# Patient Record
Sex: Male | Born: 1970
Health system: Southern US, Community
[De-identification: ages and names within clinical notes are randomized; demographics above are authoritative.]

## PROBLEM LIST (undated history)

## (undated) DIAGNOSIS — C801 Malignant (primary) neoplasm, unspecified: Secondary | ICD-10-CM

## (undated) DIAGNOSIS — N189 Chronic kidney disease, unspecified: Secondary | ICD-10-CM

## (undated) DIAGNOSIS — I10 Essential (primary) hypertension: Secondary | ICD-10-CM

## (undated) DIAGNOSIS — R7303 Prediabetes: Secondary | ICD-10-CM

## (undated) HISTORY — PX: VASECTOMY: SHX75

## (undated) HISTORY — PX: ROTATOR CUFF REPAIR: SHX139

## (undated) HISTORY — PX: HERNIA REPAIR: SHX51

---

## 2004-02-03 ENCOUNTER — Emergency Department (HOSPITAL_COMMUNITY): Admission: EM | Admit: 2004-02-03 | Discharge: 2004-02-03 | Payer: Self-pay | Admitting: Emergency Medicine

## 2004-02-04 ENCOUNTER — Encounter (INDEPENDENT_AMBULATORY_CARE_PROVIDER_SITE_OTHER): Payer: Self-pay | Admitting: Cardiology

## 2004-02-04 ENCOUNTER — Inpatient Hospital Stay (HOSPITAL_COMMUNITY): Admission: EM | Admit: 2004-02-04 | Discharge: 2004-02-06 | Payer: Self-pay | Admitting: Neurology

## 2004-02-06 ENCOUNTER — Encounter: Payer: Self-pay | Admitting: Cardiology

## 2004-02-25 ENCOUNTER — Encounter: Admission: RE | Admit: 2004-02-25 | Discharge: 2004-05-25 | Payer: Self-pay | Admitting: Neurology

## 2007-09-02 ENCOUNTER — Inpatient Hospital Stay (HOSPITAL_COMMUNITY): Admission: EM | Admit: 2007-09-02 | Discharge: 2007-09-04 | Payer: Self-pay | Admitting: Emergency Medicine

## 2010-04-04 ENCOUNTER — Emergency Department (HOSPITAL_COMMUNITY): Admission: EM | Admit: 2010-04-04 | Discharge: 2010-04-04 | Payer: Self-pay | Admitting: Family Medicine
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
## 2011-01-22 NOTE — Consult Note (Signed)
NAME:  RENEL, ENDE NO.:  [id number]   MEDICAL RECORD NO.:  [id number]                   PATIENT TYPE:  INP   LOCATION:  NA                                   FACILITY:  [facility]   PHYSICIAN:  Melvyn Novas, M.D.               DATE OF BIRTH:  04-24-1971   DATE OF CONSULTATION:  DATE OF DISCHARGE:                                   CONSULTATION   This 40 year old otherwise very healthy African American right-handed  gentleman is seen on Feb 03, 2004, at [facility] for a second  time within 24 hours. The patient presents with left-sided arm and leg  numbness, clumsiness, and a tendency to fall. He was unsafe walking to the  bathroom. He states that he woke up at 5 a.m. this morning and noticed left-  sided numbness with pin and needle dysesthesia and then stumbled and fell to  the left. He had a numbness spell equal in quality last Saturday was less  severe and lasted for less than 30 minutes. The patient has been on no  medications, but has a history of hypertension which is currently untreated.  He states he came to the ER this morning, waited five hours, and was  discharged after his symptoms resolved and CT of the brain was negative.  When returning home, he tried to take a nap, his mother gave him aspirin and  he presented two hours later with all symptoms recurrent. He enters the ER  again. He still complains of left-sided numbness, a feeling of heaviness and  clumsiness in the left extremities. He also had transient neck pain and  stiffness which has now resolved.   No medications.   PAST MEDICAL HISTORY:  No past medical history except hypertension.   FAMILY HISTORY:  Hypertension in mother and grandfather.   SOCIAL HISTORY:  No alcohol and nonsmoker. He is single.   PHYSICAL EXAMINATION:  LUNGS: Clear to auscultation.  COR: Regular rate and rhythm.  VITAL SIGNS: Blood pressure 169/98, heart rate 88, temperature 98.7.  EXTREMITIES: The patient has no clubbing, cyanosis, or edema. He is  muscular, well built, and in no acute distress.  MENTAL STATUS: Alert and oriented times three.  NEUROLOGIC: Cranial nerves reveals full extraocular movements. The pupils  are equal and respond to accommodation as well as to light. There are no  visual field deficits. No papilledema. No diplopia. Facial symmetry and  sensory preserved. Tongue and uvula midline. Gag is normal. On motor exam,  5/5, equal grips, no ___________, equal strength, full dorsiflexion and  plantarflexion. All deep tendon reflexes are symmetric, downgoing toes to  plantar stimulation. On sensory exam, decreased primary modalities in arms  and legs on the left. On coordination, the patient drifts to the left when  sitting up.  Finger-to-nose test shows dysmetria on the left which is  significant. The patient can stand with assistance, but  again drifts to the  left, so we deferred the Romberg test.   PLAN:  We will admit the patient for workup of repeated paroxysmal left body  numbness and fall tendencies. This could be a cerebellar or brainstem event,  but it is also possible that the patient did not have an ischemic event, but  vasospasms and responds to hypertension. A dissection of his medullary signs  or demyelinating event given his age also not likely with his gender. He  denies having any preceding viral infections or any history of recent  medication changes or trauma. The patient needs MRI and MRA of the neck.  Since we cannot obtain this tonight at [facility] I suggest that  we transfer the patient to the [facility] stroke service for the  full  completement of workup. The patient will be admitted to the stroke MD  service  and followed by Dr. Delia Heady. No primary care physician is  listed.  The patient mentions that he would like to have his primary care  followed by the physician of his mother, Dr. Domingo Cocking  __________, with  [facility].                                               Melvyn Novas, M.D.    CD/MEDQ  D:  02/03/2004  T:  02/04/2004  Job:  [id number]

## 2011-01-22 NOTE — Discharge Summary (Signed)
NAME:  Dale Odom, Dale Odom                        ACCOUNT NO.:  [id number]   MEDICAL RECORD NO.:  [id number]                   PATIENT TYPE:  INP   LOCATION:  3017                                 FACILITY:  [facility]   PHYSICIAN:  Pramod P. Pearlean Brownie, MD                 DATE OF BIRTH:  1971-02-22   DATE OF ADMISSION:  02/04/2004  DATE OF DISCHARGE:  02/06/2004                                 DISCHARGE SUMMARY   ADMISSION DIAGNOSIS:  Stroke.   DISCHARGE DIAGNOSES:  1. Right posterior limb internal capsular infarction secondary to deep     penetrating artery disease.  2. Hypertension.  3. Hyperlipidemia.  4. Hyperhomocysteinemia.  5. Smoking.   HISTORY OF PRESENT ILLNESS:  Mr. Ozanich is a 40 year old African-American  male who developed sudden onset of left-sided paresthesias and weakness.  He  was seen at the emergency room at [facility] and since his  symptoms improved within 30 minutes and a baseline CT scan was normal, he  was discharged home.  He went back and slept the night.  When he woke up in  the morning he noted some recurrent symptoms with left-sided weakness and  numbness and came back to the emergency room for further evaluation.   At this time neurology was consulted and the patient was admitted for  further stroke risk stratification workup, and he was transferred to [facility], where he was admitted to the stroke unit.  He was kept on  telemetry monitoring, which did not reveal any significant cardiac  arrhythmias.  MRI scan of the brain was obtained the next day, which  revealed a small right posterior limb internal capsular artery infarction.  There was no high-grade stenosis noted of intracranial blood vessels.  The  carotid artery was found to reveal no significant stenosis.  Transcranial  Doppler studies revealed normal vein flow velocities in anterior  circulation.  The orbital views were suboptimal, limiting evaluation of  carotid and ophthalmic  arteries.  Transthoracic echocardiogram revealed  normal ejection fraction without obvious cardiac source of embolism.  Transthoracic bubble study revealed no evidence of intracardiac right-to-  left shunt.  The patient's homocysteine was elevated at 23.72 mg%.  Cholesterol was also elevated at 244 mg%.  Triglycerides were elevated at  362 mg%.  LDL 135 mg%.  HDL was 37 m%.  Given patient's young age, a  hypercoagulable panel was sent.  Protein C and S were normal.  Factor V  Leiden mutation was negative.  Antiphospholipid antibody panel was also  negative.  The patient was started on Foltx for his elevated homocysteine  levels and on Aggrenox one capsule once a day for two weeks, followed by  increasing it to twice a day for secondary stroke prevention.  Lipitor 10 mg  a day was added for his elevated lipids, and Altace 5 mg for his  hypertension.  The patient was advised  to go on a low-salt, low-fat diet and  also was advised smoking cessation counseling.  At time of discharge the  patient was able to ambulate independently, though he has slight minimum  weakness in the left side.  Physical and occupational therapy was consulted.  Initially the patient was thought to be a good candidate for rehab but since  he improved significantly, he was discharged home and arrangements were made  for outpatient PT and OT.   DISCHARGE MEDICATIONS:  1. Aggrenox one capsule once a day for two weeks and then twice a day.  2. Foltx one tablet daily.  3. Lipitor 10 mg a day.  4. Altace 5 mg a day.   The patient was advised to find himself a primary medical doctor to follow  up for his smoking cessation, hyperlipidemia, and hypertension.  We  recommended [employer]. Renae Gloss, M.D., or Olene Craven, M.D.  The  patient was given their phone numbers to call and make an appointment.  He  was also asked to see Dr. Pearlean Brownie and follow up in two to three months.                                                 Pramod P. Pearlean Brownie, MD    PPS/MEDQ  D:  03/16/2004  T:  03/16/2004  Job:  [id number]

## 2011-06-11 LAB — CBC
HCT: 40.7
HCT: 47.6
Hemoglobin: 14.2
Hemoglobin: 16
MCHC: 33.6
MCHC: 34.8
MCV: 84
MCV: 85.8
Platelets: 235
Platelets: 262
RBC: 4.84
RBC: 5.55
RDW: 13.6
RDW: 13.9
WBC: 14.1 — ABNORMAL HIGH
WBC: 8.1

## 2011-06-11 LAB — DIFFERENTIAL
Basophils Absolute: 0
Basophils Relative: 0
Eosinophils Absolute: 0
Eosinophils Relative: 0
Lymphocytes Relative: 8 — ABNORMAL LOW
Lymphs Abs: 1.1
Monocytes Absolute: 0.8
Monocytes Relative: 6
Neutro Abs: 12.1 — ABNORMAL HIGH
Neutrophils Relative %: 86 — ABNORMAL HIGH

## 2011-06-11 LAB — COMPREHENSIVE METABOLIC PANEL
ALT: 46
AST: 51 — ABNORMAL HIGH
Albumin: 4.1
Alkaline Phosphatase: 86
BUN: 14
CO2: 29
Calcium: 9.6
Chloride: 100
Creatinine, Ser: 1.7 — ABNORMAL HIGH
GFR calc Af Amer: 55 — ABNORMAL LOW
GFR calc non Af Amer: 46 — ABNORMAL LOW
Glucose, Bld: 125 — ABNORMAL HIGH
Potassium: 4.5
Sodium: 135
Total Bilirubin: 0.7
Total Protein: 7.3

## 2011-06-11 LAB — BASIC METABOLIC PANEL
BUN: 10
CO2: 30
Calcium: 9.1
Chloride: 101
Creatinine, Ser: 1.77 — ABNORMAL HIGH
GFR calc Af Amer: 53 — ABNORMAL LOW
GFR calc non Af Amer: 44 — ABNORMAL LOW
Glucose, Bld: 103 — ABNORMAL HIGH
Potassium: 4.2
Sodium: 135

## 2011-07-18 ENCOUNTER — Emergency Department (HOSPITAL_COMMUNITY)
Admission: EM | Admit: 2011-07-18 | Discharge: 2011-07-18 | Disposition: A | Discharge status: Home / Self Care | Payer: 59 | Source: Home / Self Care

## 2011-07-18 ENCOUNTER — Encounter: Payer: Self-pay | Admitting: *Deleted

## 2011-07-18 DIAGNOSIS — IMO0001 Reserved for inherently not codable concepts without codable children: Secondary | ICD-10-CM

## 2011-07-18 DIAGNOSIS — L259 Unspecified contact dermatitis, unspecified cause: Secondary | ICD-10-CM

## 2011-07-18 DIAGNOSIS — M791 Myalgia, unspecified site: Secondary | ICD-10-CM

## 2011-07-18 HISTORY — DX: Essential (primary) hypertension: I10

## 2011-07-18 LAB — POCT URINALYSIS DIP (DEVICE)
Bilirubin Urine: NEGATIVE
Glucose, UA: NEGATIVE mg/dL
Ketones, ur: NEGATIVE mg/dL
Leukocytes, UA: NEGATIVE
Nitrite: NEGATIVE
Protein, ur: 30 mg/dL — AB
Specific Gravity, Urine: 1.015 (ref 1.005–1.030)
Urobilinogen, UA: 0.2 mg/dL (ref 0.0–1.0)
pH: 6.5 (ref 5.0–8.0)

## 2011-07-18 MED ORDER — PSEUDOEPHEDRINE-CODEINE-GG 30-10-100 MG/5ML PO SOLN: 10.0000 mL | Freq: Four times a day (QID) | ORAL | Status: AC | PRN

## 2011-07-18 MED ORDER — IBUPROFEN 800 MG PO TABS: 800.0000 mg | ORAL_TABLET | Freq: Three times a day (TID) | ORAL | Status: AC

## 2011-07-18 MED ORDER — TRIAMCINOLONE ACETONIDE 0.5 % EX OINT: TOPICAL_OINTMENT | Freq: Two times a day (BID) | CUTANEOUS | Status: AC

## 2011-07-18 MED ORDER — CYCLOBENZAPRINE HCL 10 MG PO TABS: 10.0000 mg | ORAL_TABLET | Freq: Two times a day (BID) | ORAL | Status: AC | PRN

## 2011-07-18 NOTE — ED Notes (Signed)
Reports pain in left abd intermittently, occurring as sharp pain only when he coughs - slowly dissipates after couple minutes.  No pain at rest.  Pains started 5 days ago.  Last BM this AM - reports as normal.  Denies n/v/d, denies constipation or fevers.  Left side tender to palpation.  Denies rib pain w/ palpation.

## 2011-07-18 NOTE — ED Provider Notes (Signed)
History     CSN: [id number] Arrival date & time: 07/18/2011  9:37 AM   First MD Initiated Contact with Patient 07/18/11 [phone]      Chief Complaint  Patient presents with  . Flank Pain    Left Side Pain    (Consider location/radiation/quality/duration/timing/severity/associated sxs/prior treatment) Patient is a 40 y.o. male presenting with flank pain and rash. The history is provided by the patient.  Flank Pain This is a new (c/o left flank pain for 5 days after recurrent coughing due to a cold that started 1 week ago and is now resolving.) problem. Episode frequency: only with cough and certain movements. The problem has not changed since onset.Pertinent negatives include no chest pain, no abdominal pain, no headaches and no shortness of breath. Associated symptoms comments: No dysuria or hematuria. No changes in bowel movements, no constipation or diarrhea. No fever or chills.. The symptoms are aggravated by bending, twisting and coughing. The symptoms are relieved by position. He has tried nothing for the symptoms.  Rash  This is a new problem. The current episode started more than 2 days ago. The problem has not changed since onset.The problem is associated with plant contact (appeared after working in the yard.). There has been no fever. The rash is present on the left arm and right arm. The pain is at a severity of 0/10. The patient is experiencing no pain. Associated symptoms include itching. Pertinent negatives include no pain. He has tried steriods (OTC hydrocortisone) for the symptoms. The treatment provided mild relief.    Past Medical History  Diagnosis Date  . Hypertension     Past Surgical History  Procedure Date  . Hernia repair   . Vasectomy     No family history on file.  History  Substance Use Topics  . Smoking status: Current Everyday Smoker -- 1.0 packs/day    Types: Cigarettes  . Smokeless tobacco: Not on file  . Alcohol Use: No      Review of Systems    Constitutional: Negative for fever, chills, appetite change and fatigue.  HENT: Positive for congestion and sneezing. Negative for rhinorrhea and sinus pressure.   Respiratory: Positive for cough. Negative for chest tightness, shortness of breath and wheezing.   Cardiovascular: Negative for chest pain, palpitations and leg swelling.  Gastrointestinal: Negative for nausea, vomiting, abdominal pain, diarrhea and constipation.  Genitourinary: Positive for flank pain. Negative for dysuria, frequency and difficulty urinating.  Musculoskeletal: Positive for myalgias.  Skin: Positive for itching and rash.  Neurological: Negative for dizziness and headaches.    Allergies  Review of patient's allergies indicates no known allergies.  Home Medications   Current Outpatient Rx  Name Route Sig Dispense Refill  . LISINOPRIL-HYDROCHLOROTHIAZIDE PO Oral Take by mouth.      . CYCLOBENZAPRINE HCL 10 MG PO TABS Oral Take 1 tablet (10 mg total) by mouth 2 (two) times daily as needed for muscle spasms. 20 tablet 0  . IBUPROFEN 800 MG PO TABS Oral Take 1 tablet (800 mg total) by mouth 3 (three) times daily. 21 tablet 0  . PSEUDOEPHEDRINE-CODEINE-GG 30-10-100 MG/5ML PO SOLN Oral Take 10 mLs by mouth 4 (four) times daily as needed for cough. 120 mL 0  . TRIAMCINOLONE ACETONIDE 0.5 % EX OINT Topical Apply topically 2 (two) times daily. 30 g 0    BP 134/93  Pulse 87  Temp(Src) 98.5 F (36.9 C) (Oral)  Resp 16  SpO2 97%  Physical Exam  Nursing note and vitals  reviewed. Constitutional: He is oriented to person, place, and time. He appears well-developed and well-nourished. No distress.  HENT:  Head: Normocephalic and atraumatic.  Mouth/Throat: No oropharyngeal exudate.       Nasal congestion  Eyes: Conjunctivae and EOM are normal. Pupils are equal, round, and reactive to light.  Cardiovascular: Normal rate, regular rhythm and normal heart sounds.   Pulmonary/Chest: Effort normal and breath sounds  normal. No respiratory distress. He has no wheezes. He has no rales. He exhibits no tenderness.  Abdominal: Soft. Bowel sounds are normal. He exhibits no distension and no mass. There is no tenderness. There is no rebound and no guarding.  Musculoskeletal:       Impress increased tone and enderness with palpation and with bending along left transverse muscle. No swelling or skin changes above.  Lymphadenopathy:    He has no cervical adenopathy.  Neurological: He is alert and oriented to person, place, and time.  Skin:       Bilateral paulo microvesicular erythema in volar surfaces of both forearms with few crusted scratch marks no signs of over infection.    ED Course  Procedures (including critical care time)  Labs Reviewed  POCT URINALYSIS DIP (DEVICE) - Abnormal; Notable for the following:    Hgb urine dipstick TRACE (*)    Protein, ur 30 (*)    All other components within normal limits  LAB REPORT - SCANNED   No results found.   1. Muscle ache   2. Contact dermatitis       MDM          Leibish Mcgregor Moreno-Coll 07/19/11 7829

## 2012-07-26 LAB — HM COLONOSCOPY

## 2014-09-30 ENCOUNTER — Ambulatory Visit (INDEPENDENT_AMBULATORY_CARE_PROVIDER_SITE_OTHER): Payer: 59 | Admitting: Family Medicine

## 2014-09-30 VITALS — BP 125/85 | HR 91 | Temp 98.1°F | Resp 18 | Ht 72.0 in | Wt 232.8 lb

## 2014-09-30 DIAGNOSIS — R739 Hyperglycemia, unspecified: Secondary | ICD-10-CM

## 2014-09-30 DIAGNOSIS — L03113 Cellulitis of right upper limb: Secondary | ICD-10-CM

## 2014-09-30 DIAGNOSIS — L209 Atopic dermatitis, unspecified: Secondary | ICD-10-CM

## 2014-09-30 LAB — POCT CBC
Granulocyte percent: 66.1 %G (ref 37–80)
HCT, POC: 44.6 % (ref 43.5–53.7)
Hemoglobin: 14.9 g/dL (ref 14.1–18.1)
Lymph, poc: 2.5 (ref 0.6–3.4)
MCH, POC: 29.8 pg (ref 27–31.2)
MCHC: 33.4 g/dL (ref 31.8–35.4)
MCV: 89.4 fL (ref 80–97)
MID (cbc): 0.5 (ref 0–0.9)
MPV: 6.9 fL (ref 0–99.8)
POC Granulocyte: 5.7 (ref 2–6.9)
POC LYMPH PERCENT: 28.5 %L (ref 10–50)
POC MID %: 5.4 %M (ref 0–12)
Platelet Count, POC: 223 10*3/uL (ref 142–424)
RBC: 4.99 M/uL (ref 4.69–6.13)
RDW, POC: 13.9 %
WBC: 8.6 10*3/uL (ref 4.6–10.2)

## 2014-09-30 LAB — POCT GLYCOSYLATED HEMOGLOBIN (HGB A1C): Hemoglobin A1C: 5.8

## 2014-09-30 MED ORDER — TRIAMCINOLONE ACETONIDE 0.1 % EX CREA: 1.0000 "application " | TOPICAL_CREAM | Freq: Two times a day (BID) | CUTANEOUS | Status: DC

## 2014-09-30 MED ORDER — CEFTRIAXONE SODIUM 1 G IJ SOLR
1.0000 g | Freq: Once | INTRAMUSCULAR | Status: AC
  Administered 2014-09-30: 1 g via INTRAMUSCULAR

## 2014-09-30 MED ORDER — DOXYCYCLINE HYCLATE 100 MG PO TABS: 100.0000 mg | ORAL_TABLET | Freq: Two times a day (BID) | ORAL | Status: DC

## 2014-09-30 MED ORDER — HYDROCODONE-ACETAMINOPHEN 5-325 MG PO TABS: 1.0000 | ORAL_TABLET | Freq: Three times a day (TID) | ORAL | Status: DC | PRN

## 2014-09-30 NOTE — Patient Instructions (Signed)
Cellulitis °Cellulitis is an infection of the skin and the tissue beneath it. The infected area is usually red and tender. Cellulitis occurs most often in the arms and lower legs.  °CAUSES  °Cellulitis is caused by bacteria that enter the skin through cracks or cuts in the skin. The most common types of bacteria that cause cellulitis are staphylococci and streptococci. °SIGNS AND SYMPTOMS  °· Redness and warmth. °· Swelling. °· Tenderness or pain. °· Fever. °DIAGNOSIS  °Your health care provider can usually determine what is wrong based on a physical exam. Blood tests may also be done. °TREATMENT  °Treatment usually involves taking an antibiotic medicine. °HOME CARE INSTRUCTIONS  °· Take your antibiotic medicine as directed by your health care provider. Finish the antibiotic even if you start to feel better. °· Keep the infected arm or leg elevated to reduce swelling. °· Apply a warm cloth to the affected area up to 4 times per day to relieve pain. °· Take medicines only as directed by your health care provider. °· Keep all follow-up visits as directed by your health care provider. °SEEK MEDICAL CARE IF:  °· You notice red streaks coming from the infected area. °· Your red area gets larger or turns dark in color. °· Your bone or joint underneath the infected area becomes painful after the skin has healed. °· Your infection returns in the same area or another area. °· You notice a swollen bump in the infected area. °· You develop new symptoms. °· You have a fever. °SEEK IMMEDIATE MEDICAL CARE IF:  °· You feel very sleepy. °· You develop vomiting or diarrhea. °· You have a general ill feeling (malaise) with muscle aches and pains. °MAKE SURE YOU:  °· Understand these instructions. °· Will watch your condition. °· Will get help right away if you are not doing well or get worse. °Document Released: 06/02/2005 Document Revised: 01/07/2014 Document Reviewed: 11/08/2011 °Exit[employer]® Patient Information ©2015 [employer].  This information is not intended to replace advice given to you by your health care provider. Make sure you discuss any questions you have with your health care provider. ° °

## 2014-09-30 NOTE — Progress Notes (Signed)
Chief Complaint:  Chief Complaint  Patient presents with  . Elbow Pain    right x 4 days     HPI: Dale Odom is a 44 y.o. male who is here for  4 day history of right peri-elbow cellulitis, he does not haveany known injury, he does have a scab over here, he ahs had no fevers or cguk, he has had redness, swelling and pain an dit radiates.  He has full ROM  Past Medical History  Diagnosis Date  . Hypertension    Past Surgical History  Procedure Laterality Date  . Hernia repair    . Vasectomy     History   Social History  . Marital Status: Married    Spouse Name: N/A    Number of Children: N/A  . Years of Education: N/A   Social History Main Topics  . Smoking status: Current Every Day Smoker -- 1.00 packs/day    Types: Cigarettes  . Smokeless tobacco: None  . Alcohol Use: No  . Drug Use: No  . Sexual Activity: Yes    Birth Control/ Protection: Surgical   Other Topics Concern  . None   Social History Narrative   History reviewed. No pertinent family history. No Known Allergies Prior to Admission medications   Medication Sig Start Date End Date Taking? Authorizing Provider  LISINOPRIL-HYDROCHLOROTHIAZIDE PO Take by mouth.     Yes Historical Provider, MD     ROS: The patient denies fevers, chills, night sweats, unintentional weight loss, chest pain, palpitations, wheezing, dyspnea on exertion, nausea, vomiting, abdominal pain, dysuria, hematuria, melena, numbness, weakness, or tingling.   All other systems have been reviewed and were otherwise negative with the exception of those mentioned in the HPI and as above.    PHYSICAL EXAM: Filed Vitals:   09/30/14 1228  BP: 125/85  Pulse: 91  Temp: 98.1 F (36.7 C)  Resp: 18   Filed Vitals:   09/30/14 1228  Height: 6' (1.829 m)  Weight: 232 lb 12.8 oz (105.597 kg)   Body mass index is 31.57 kg/(m^2).  General: Alert, no acute distress HEENT:  Normocephalic, atraumatic, oropharynx patent. EOMI,  PERRLA Cardiovascular:  Regular rate and rhythm, no rubs murmurs or gallops.  No Carotid bruits, radial pulse intact. No pedal edema.  Respiratory: Clear to auscultation bilaterally.  No wheezes, rales, or rhonchi.  No cyanosis, no use of accessory musculature GI: No organomegaly, abdomen is soft and non-tender, positive bowel sounds.  No masses. Skin: + eczema on left upper arm. + right elbow cellulitis. Neurologic: Facial musculature symmetric. Psychiatric: Patient is appropriate throughout our interaction. Lymphatic: No cervical lymphadenopathy Musculoskeletal: Gait intact. + full ROm and strength of elbow.    LABS: Results for orders placed or performed during the hospital encounter of 07/18/11  POCT urinalysis dip (device)  Result Value Ref Range   Glucose, UA NEGATIVE NEGATIVE mg/dL   Bilirubin Urine NEGATIVE NEGATIVE   Ketones, ur NEGATIVE NEGATIVE mg/dL   Specific Gravity, Urine 1.015 1.005 - 1.030   Hgb urine dipstick TRACE (A) NEGATIVE   pH 6.5 5.0 - 8.0   Protein, ur 30 (A) NEGATIVE mg/dL   Urobilinogen, UA 0.2 0.0 - 1.0 mg/dL   Nitrite NEGATIVE NEGATIVE   Leukocytes, UA NEGATIVE NEGATIVE     EKG/XRAY:   Primary read interpreted by Dr. Marin Comment at [facility].   ASSESSMENT/PLAN: Encounter Diagnoses  Name Primary?  . Cellulitis of right upper extremity Yes  . Atopic eczema   .  Hyperglycemia    Rx rocephin IM x 1 in house Rx Doxcycline 100 mg BID x 10 days, coverage for MRSA since he works in [employer] at the hospital RX triamcinolone for [place] for pain Precautions given for spetic joint , worsenign sxs.  Marked with skin marker  Gross sideeffects, risk and benefits, and alternatives of medications d/w patient. Patient is aware that all medications have potential sideeffects and we are unable to predict every sideeffect or drug-drug interaction that may occur.  LE, Robert Lee, DO 09/30/2014 2:01 PM

## 2014-10-13 ENCOUNTER — Ambulatory Visit (INDEPENDENT_AMBULATORY_CARE_PROVIDER_SITE_OTHER): Payer: 59 | Admitting: Family Medicine

## 2014-10-13 VITALS — BP 116/82 | HR 74 | Temp 97.4°F | Resp 16 | Ht 73.0 in | Wt 236.8 lb

## 2014-10-13 DIAGNOSIS — L03113 Cellulitis of right upper limb: Secondary | ICD-10-CM

## 2014-10-13 DIAGNOSIS — L03818 Cellulitis of other sites: Secondary | ICD-10-CM

## 2014-10-13 DIAGNOSIS — M25521 Pain in right elbow: Secondary | ICD-10-CM

## 2014-10-13 MED ORDER — DOXYCYCLINE HYCLATE 100 MG PO TABS: 100.0000 mg | ORAL_TABLET | Freq: Two times a day (BID) | ORAL | Status: DC

## 2014-10-13 NOTE — Patient Instructions (Signed)
Olecranon Bursitis Bursitis is swelling and soreness (inflammation) of a fluid-filled sac (bursa) that covers and protects a joint. Olecranon bursitis occurs over the elbow.  CAUSES Bursitis can be caused by injury, overuse of the joint, arthritis, or infection.  SYMPTOMS   Tenderness, swelling, warmth, or redness over the elbow.  Elbow pain with movement. This is greater with bending the elbow.  Squeaking sound when the bursa is rubbed or moved.  Increasing size of the bursa without pain or discomfort.  Fever with increasing pain and swelling if the bursa becomes infected. HOME CARE INSTRUCTIONS   Put ice on the affected area.  Put ice in a plastic bag.  Place a towel between your skin and the bag.  Leave the ice on for 15-20 minutes each hour while awake. Do this for the first 2 days.  When resting, elevate your elbow above the level of your heart. This helps reduce swelling.  Continue to put the joint through a full range of motion 4 times per day. Rest the injured joint at other times. When the pain lessens, begin normal slow movements and usual activities.  Only take over-the-counter or prescription medicines for pain, discomfort, or fever as directed by your caregiver.  Reduce your intake of milk and related dairy products (cheese, yogurt). They may make your condition worse. SEEK IMMEDIATE MEDICAL CARE IF:   Your pain increases even during treatment.  You have a fever.  You have heat and inflammation over the bursa and elbow.  You have a red line that goes up your arm.  You have pain with movement of your elbow. MAKE SURE YOU:   Understand these instructions.  Will watch your condition.  Will get help right away if you are not doing well or get worse. Document Released: 09/22/2006 Document Revised: 11/15/2011 Document Reviewed: 08/08/2007 [facility] Patient Information 2015 [place], [place]. This information is not intended to replace advice given to you by your  health care provider. Make sure you discuss any questions you have with your health care provider. Cellulitis Cellulitis is an infection of the skin and the tissue beneath it. The infected area is usually red and tender. Cellulitis occurs most often in the arms and lower legs.  CAUSES  Cellulitis is caused by bacteria that enter the skin through cracks or cuts in the skin. The most common types of bacteria that cause cellulitis are staphylococci and streptococci. SIGNS AND SYMPTOMS   Redness and warmth.  Swelling.  Tenderness or pain.  Fever. DIAGNOSIS  Your health care provider can usually determine what is wrong based on a physical exam. Blood tests may also be done. TREATMENT  Treatment usually involves taking an antibiotic medicine. HOME CARE INSTRUCTIONS   Take your antibiotic medicine as directed by your health care provider. Finish the antibiotic even if you start to feel better.  Keep the infected arm or leg elevated to reduce swelling.  Apply a warm cloth to the affected area up to 4 times per day to relieve pain.  Take medicines only as directed by your health care provider.  Keep all follow-up visits as directed by your health care provider. SEEK MEDICAL CARE IF:   You notice red streaks coming from the infected area.  Your red area gets larger or turns dark in color.  Your bone or joint underneath the infected area becomes painful after the skin has healed.  Your infection returns in the same area or another area.  You notice a swollen bump in the  infected area.  You develop new symptoms.  You have a fever. SEEK IMMEDIATE MEDICAL CARE IF:   You feel very sleepy.  You develop vomiting or diarrhea.  You have a general ill feeling (malaise) with muscle aches and pains. MAKE SURE YOU:   Understand these instructions.  Will watch your condition.  Will get help right away if you are not doing well or get worse. Document Released: 06/02/2005 Document  Revised: 01/07/2014 Document Reviewed: 11/08/2011 [facility] Patient Information 2015 [place], [place]. This information is not intended to replace advice given to you by your health care provider. Make sure you discuss any questions you have with your health care provider.

## 2014-10-13 NOTE — Progress Notes (Signed)
Chief Complaint:  Chief Complaint  Patient presents with  . Follow-up    cellulitis r elbow    HPI: Dale Odom is a 44 y.o. male who is here for  Right elbow cellulitis recheck, doing bettter, redness is gone,  Area where he has been putting warm compresses have peeling dry skin, he is moving elbow better.  He still has pain and minimal swelling when he flexes at olecranon, still minimal swelling that is noticeable when he bends his elbow.  Has been looking up how he can get rid of it on google and wants to know if it will go away soon. He has had no SEs with meds. Was on doxycycline for 10 days.  Past Medical History  Diagnosis Date  . Hypertension    Past Surgical History  Procedure Laterality Date  . Hernia repair    . Vasectomy     History   Social History  . Marital Status: Married    Spouse Name: N/A    Number of Children: N/A  . Years of Education: N/A   Social History Main Topics  . Smoking status: Current Every Day Smoker -- 1.00 packs/day    Types: Cigarettes  . Smokeless tobacco: None  . Alcohol Use: No  . Drug Use: No  . Sexual Activity: Yes    Birth Control/ Protection: Surgical   Other Topics Concern  . None   Social History Narrative   History reviewed. No pertinent family history. No Known Allergies Prior to Admission medications   Medication Sig Start Date End Date Taking? Authorizing Provider  HYDROcodone-acetaminophen (NORCO) 5-325 MG per tablet Take 1 tablet by mouth every 8 (eight) hours as needed for moderate pain. Take with stool softener, no extra tylenol 09/30/14  Yes Thao P Le, DO  LISINOPRIL-HYDROCHLOROTHIAZIDE PO Take by mouth.     Yes Historical Provider, MD  triamcinolone cream (KENALOG) 0.1 % Apply 1 application topically 2 (two) times daily. 09/30/14  Yes Thao P Le, DO  doxycycline (VIBRA-TABS) 100 MG tablet Take 1 tablet (100 mg total) by mouth 2 (two) times daily. Patient not taking: Reported on 10/13/2014 09/30/14   Thao  P Le, DO     ROS: The patient denies fevers, chills, night sweats, unintentional weight loss, chest pain, palpitations, wheezing, dyspnea on exertion, nausea, vomiting, abdominal pain, dysuria, hematuria, melena, numbness, weakness, or tingling.  All other systems have been reviewed and were otherwise negative with the exception of those mentioned in the HPI and as above.    PHYSICAL EXAM: Filed Vitals:   10/13/14 1020  BP: 116/82  Pulse: 74  Temp: 97.4 F (36.3 C)  Resp: 16   Filed Vitals:   10/13/14 1020  Height: 6\' 1"  (1.854 m)  Weight: 236 lb 12.8 oz (107.412 kg)   Body mass index is 31.25 kg/(m^2).  General: Alert, no acute distress HEENT:  Normocephalic, atraumatic, oropharynx patent. EOMI, PERRLA Cardiovascular:  Regular rate and rhythm, no rubs murmurs or gallops.  No Carotid bruits, radial pulse intact. No pedal edema.  Respiratory: Clear to auscultation bilaterally.  No wheezes, rales, or rhonchi.  No cyanosis, no use of accessory musculature GI: No organomegaly, abdomen is soft and non-tender, positive bowel sounds.  No masses. Skin: No rashes. Neurologic: Facial musculature symmetric. Psychiatric: Patient is appropriate throughout our interaction. Lymphatic: No cervical lymphadenopathy Musculoskeletal: Gait intact. Right elbow cellulitis vastly improved with some olcranon bursa swelling.  Full ROM of elbow, no e.op spetic joint  LABS: Results for orders placed or performed in visit on 09/30/14  POCT CBC  Result Value Ref Range   WBC 8.6 4.6 - 10.2 K/uL   Lymph, poc 2.5 0.6 - 3.4   POC LYMPH PERCENT 28.5 10 - 50 %L   MID (cbc) 0.5 0 - 0.9   POC MID % 5.4 0 - 12 %M   POC Granulocyte 5.7 2 - 6.9   Granulocyte percent 66.1 37 - 80 %G   RBC 4.99 4.69 - 6.13 M/uL   Hemoglobin 14.9 14.1 - 18.1 g/dL   HCT, POC 44.6 43.5 - 53.7 %   MCV 89.4 80 - 97 fL   MCH, POC 29.8 27 - 31.2 pg   MCHC 33.4 31.8 - 35.4 g/dL   RDW, POC 13.9 %   Platelet Count, POC 223 142 -  424 K/uL   MPV 6.9 0 - 99.8 fL  POCT glycosylated hemoglobin (Hb A1C)  Result Value Ref Range   Hemoglobin A1C 5.8      EKG/XRAY:   Primary read interpreted by Dr. Marin Comment at [facility].   ASSESSMENT/PLAN: Encounter Diagnoses  Name Primary?  . Cellulitis of other specified site Yes  . Elbow pain, right   . Cellulitis of right upper extremity    Rx doxy for another 7 days for prevention since cellulitis for most part is resolved  but he will stop after this. He may after all this heals have a little olecranon bursitis but I do not feel there is anythign do for this. An aspiration at this time will  Have more risks than benefits Can work out in moderation F/u prn   Gross sideeffects, risk and benefits, and alternatives of medications d/w patient. Patient is aware that all medications have potential sideeffects and we are unable to predict every sideeffect or drug-drug interaction that may occur.  LE, [place], DO 10/13/2014 10:53 AM

## 2014-11-12 ENCOUNTER — Other Ambulatory Visit: Payer: Self-pay | Admitting: Nephrology

## 2014-11-12 DIAGNOSIS — N183 Chronic kidney disease, stage 3 unspecified: Secondary | ICD-10-CM

## 2014-11-19 ENCOUNTER — Ambulatory Visit
Admission: RE | Admit: 2014-11-19 | Discharge: 2014-11-19 | Disposition: A | Discharge status: Home / Self Care | Payer: 59 | Source: Ambulatory Visit | Attending: Nephrology | Admitting: Nephrology

## 2014-11-19 DIAGNOSIS — N183 Chronic kidney disease, stage 3 unspecified: Secondary | ICD-10-CM

## 2015-01-02 ENCOUNTER — Ambulatory Visit (INDEPENDENT_AMBULATORY_CARE_PROVIDER_SITE_OTHER): Payer: 59

## 2015-01-02 ENCOUNTER — Ambulatory Visit (INDEPENDENT_AMBULATORY_CARE_PROVIDER_SITE_OTHER): Payer: 59 | Admitting: Podiatry

## 2015-01-02 ENCOUNTER — Encounter: Payer: Self-pay | Admitting: Podiatry

## 2015-01-02 ENCOUNTER — Telehealth: Payer: Self-pay | Admitting: *Deleted

## 2015-01-02 VITALS — BP 157/95 | HR 63 | Resp 16

## 2015-01-02 DIAGNOSIS — Q828 Other specified congenital malformations of skin: Secondary | ICD-10-CM | POA: Diagnosis not present

## 2015-01-02 DIAGNOSIS — M79673 Pain in unspecified foot: Secondary | ICD-10-CM

## 2015-01-02 DIAGNOSIS — B353 Tinea pedis: Secondary | ICD-10-CM

## 2015-01-02 DIAGNOSIS — M2040 Other hammer toe(s) (acquired), unspecified foot: Secondary | ICD-10-CM

## 2015-01-02 MED ORDER — NAFTIFINE HCL 2 % EX CREA: 1.0000 [drp] | TOPICAL_CREAM | CUTANEOUS | Status: DC

## 2015-01-02 NOTE — Progress Notes (Signed)
   Subjective:    Patient ID: Dale Odom, male    DOB: 1971-04-24, 44 y.o.   MRN: [id number]  HPI Comments: "I have all these calluses"  Patient c/o tender callused areas 1st toes and 4th toes bilateral and lateral heels bilateral for several years. Very painful when he walks. He gets regular pedicures to have them trimmed. Also uses athletes feet spray.     Review of Systems  Musculoskeletal: Positive for gait problem.  All other systems reviewed and are negative.      Objective:   Physical Exam: I have reviewed his past medical history medications allergy surgery social history and review of systems. Pulses are strongly palpable bilateral neurologic sensorium is intact per Semmes-Weinstein monofilament. Deep tendon reflexes are intact bilaterally muscle strength is 5 over 5 dorsiflexion plantar flexors and inverters everters all digits of musculature is intact. Orthopedic evaluation demonstrates mild-to-moderate pes planus bilateral. He has stiffness and limitation on range of motion of all of his metatarsophalangeal joints. Ankle joint appears to be normal. Radiographic evaluation demonstrates normal osseous foot in appearance. However the sulcus does not send all way to the metatarsophalangeal joint. He has normal bone length on radiograph however his toes appear to be very short period social a shallow sulcus is more than likely was resulting in his limitation on range of motion. Cutaneous evaluation demonstrates tinea pedis bilateral and multiple areas of porokeratosis and tyloma was bilateral. At this point I debrided all of these today        Assessment & Plan:  Assessment: Tinea pedis and poor keratoses bilateral.  Plan: Debridement of reactive hyperkeratoses bilateral. Also discussed the need for orthotics. He declined. Follow up with him in 1 month he will start the use of Naftin gel twice daily.

## 2015-01-02 NOTE — Telephone Encounter (Signed)
[name] states the directions for the pt's Naftin Cream are not clear.  Informed Dale Odom the instruction for Naftin Cream were apply to affected area daily.

## 2015-06-03 ENCOUNTER — Encounter: Payer: Self-pay | Admitting: Podiatry

## 2015-06-03 ENCOUNTER — Ambulatory Visit (INDEPENDENT_AMBULATORY_CARE_PROVIDER_SITE_OTHER): Payer: 59 | Admitting: Podiatry

## 2015-06-03 VITALS — BP 122/78 | HR 78 | Resp 16

## 2015-06-03 DIAGNOSIS — M2040 Other hammer toe(s) (acquired), unspecified foot: Secondary | ICD-10-CM | POA: Diagnosis not present

## 2015-06-03 DIAGNOSIS — Q828 Other specified congenital malformations of skin: Secondary | ICD-10-CM

## 2015-06-03 NOTE — Progress Notes (Signed)
He presents today for follow-up of his poor keratoses to his lesser digits bilaterally. He states these are becoming so painful on starting To take pain pills. He states that I am unable to reach the lesions in between mitoses however I can adequately do bride the keratotic lesions on the bottom of my feet. He states that he has diabetes and he tries to control his diabetes with exercise at the gym.  Objective: Vital signs are stable he is alert and oriented 3. He is well known to me. No acute distress vital signs stable alert and oriented 3 pulses are palpable. Reactive hyperkeratosis of porokeratosis to the plantar aspect and the lateral aspect of the knee of his toes. The left foot in particular appears to have adductovarus rotated hammertoes. He has very short toes but on radiograph they are of normal length. I think that is because of the rotation of the toes and the length of the toes that these reactive hyperkeratosis are worse although the DIPJ.  Assessment: Porokeratosis bilateral. 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  Plan: I am requesting that he see Dr. Jacqualyn Posey just for an evaluation and possibly come up with another idea to help relieve some of his symptoms. I'm wondering if derotational arthroplasties would work for him. We debrided all reactive hyperkeratosis today without iatrogenic lesions.

## 2015-06-10 ENCOUNTER — Other Ambulatory Visit: Payer: Self-pay | Admitting: Podiatry

## 2015-07-04 ENCOUNTER — Ambulatory Visit: Payer: 59 | Admitting: Podiatry

## 2015-10-06 MED FILL — ROSUVASTATIN CALCIUM 40 MG: 40 | 90 days supply | Qty: 90 | Fill #1

## 2015-10-06 MED FILL — valACYclovir HCL 1 GM TABS: 1 | 30 days supply | Qty: 30 | Fill #6

## 2015-10-06 MED FILL — LISINOPRIL-HCTZ 20-25 MG TA: 20-25 | 90 days supply | Qty: 90 | Fill #2

## 2015-11-20 ENCOUNTER — Other Ambulatory Visit: Payer: Self-pay | Admitting: Podiatry

## 2015-11-20 MED FILL — valACYclovir HCL 1 GM TABS: 1 | 30 days supply | Qty: 30 | Fill #7

## 2015-11-20 MED FILL — NAFTIFINE HCL 2% CREAM: 2 | 60 days supply | Qty: 60 | Fill #0

## 2015-11-20 MED FILL — CIALIS 10 MG TABLET: 10 | 60 days supply | Qty: 12 | Fill #1

## 2015-12-01 DIAGNOSIS — N183 Chronic kidney disease, stage 3 (moderate): Secondary | ICD-10-CM | POA: Diagnosis not present

## 2015-12-01 DIAGNOSIS — E7800 Pure hypercholesterolemia, unspecified: Secondary | ICD-10-CM | POA: Diagnosis not present

## 2015-12-01 DIAGNOSIS — R7309 Other abnormal glucose: Secondary | ICD-10-CM | POA: Diagnosis not present

## 2016-01-08 DIAGNOSIS — B353 Tinea pedis: Secondary | ICD-10-CM

## 2016-01-08 MED FILL — valACYclovir HCL 1 GM TABS: 1 | 30 days supply | Qty: 30 | Fill #8

## 2016-01-08 MED FILL — CIALIS 10 MG TABLET: 10 | 60 days supply | Qty: 12 | Fill #0

## 2016-02-04 DIAGNOSIS — N2581 Secondary hyperparathyroidism of renal origin: Secondary | ICD-10-CM | POA: Diagnosis not present

## 2016-02-04 DIAGNOSIS — R7989 Other specified abnormal findings of blood chemistry: Secondary | ICD-10-CM | POA: Diagnosis not present

## 2016-02-04 DIAGNOSIS — Z720 Tobacco use: Secondary | ICD-10-CM | POA: Diagnosis not present

## 2016-02-04 DIAGNOSIS — N183 Chronic kidney disease, stage 3 (moderate): Secondary | ICD-10-CM | POA: Diagnosis not present

## 2016-03-08 MED FILL — LISINOPRIL-HCTZ 20-25 MG TA: 20-25 | 90 days supply | Qty: 90 | Fill #0

## 2016-03-08 MED FILL — ROSUVASTATIN CALCIUM 40 MG: 40 | 90 days supply | Qty: 90 | Fill #0

## 2016-03-11 DIAGNOSIS — E1121 Type 2 diabetes mellitus with diabetic nephropathy: Secondary | ICD-10-CM | POA: Diagnosis not present

## 2016-03-11 DIAGNOSIS — I10 Essential (primary) hypertension: Secondary | ICD-10-CM | POA: Diagnosis not present

## 2016-03-11 DIAGNOSIS — Z0000 Encounter for general adult medical examination without abnormal findings: Secondary | ICD-10-CM | POA: Diagnosis not present

## 2016-03-11 DIAGNOSIS — Z23 Encounter for immunization: Secondary | ICD-10-CM | POA: Diagnosis not present

## 2016-03-11 DIAGNOSIS — I129 Hypertensive chronic kidney disease with stage 1 through stage 4 chronic kidney disease, or unspecified chronic kidney disease: Secondary | ICD-10-CM | POA: Diagnosis not present

## 2016-03-11 MED FILL — CIALIS 10 MG TABLET: 10 | 60 days supply | Qty: 12 | Fill #1

## 2016-03-12 MED FILL — VIT D2 1.25 MG (50,000 UNIT: 1.25 MG | 56 days supply | Qty: 16 | Fill #0

## 2016-03-23 MED FILL — valACYclovir HCL 1 GM TABS: 1 | 30 days supply | Qty: 30 | Fill #0

## 2016-05-03 MED FILL — valACYclovir HCL 1 GM TABS: 1 | 30 days supply | Qty: 30 | Fill #1

## 2016-06-28 MED FILL — valACYclovir HCL 1 GM TABS: 1 | 30 days supply | Qty: 30 | Fill #2

## 2016-06-29 MED FILL — CIALIS 10 MG TABLET: 10 | 60 days supply | Qty: 12 | Fill #0

## 2016-07-19 MED FILL — ROSUVASTATIN CALCIUM 40 MG: 40 | 90 days supply | Qty: 90 | Fill #0

## 2016-07-19 MED FILL — LISINOPRIL-HCTZ 20-25 MG TA: 20-25 | 90 days supply | Qty: 90 | Fill #0

## 2016-09-01 ENCOUNTER — Ambulatory Visit (INDEPENDENT_AMBULATORY_CARE_PROVIDER_SITE_OTHER): Payer: 59 | Admitting: Physician Assistant

## 2016-09-01 VITALS — BP 138/88 | HR 91 | Temp 97.9°F | Resp 18 | Ht 73.0 in | Wt 232.0 lb

## 2016-09-01 DIAGNOSIS — L239 Allergic contact dermatitis, unspecified cause: Secondary | ICD-10-CM | POA: Diagnosis not present

## 2016-09-01 DIAGNOSIS — R21 Rash and other nonspecific skin eruption: Secondary | ICD-10-CM

## 2016-09-01 MED ORDER — DESONIDE 0.05 % EX FOAM: Freq: Two times a day (BID) | CUTANEOUS | 0 refills | Status: DC

## 2016-09-01 MED ORDER — METHYLPREDNISOLONE ACETATE 80 MG/ML IJ SUSP
80.0000 mg | Freq: Once | INTRAMUSCULAR | Status: AC
  Administered 2016-09-01: 80 mg via INTRAMUSCULAR

## 2016-09-01 MED FILL — VERDESO 0.05% FOAM: 0.05 | 30 days supply | Qty: 100 | Fill #0

## 2016-09-01 MED FILL — CIALIS 10 MG TABLET: 10 | 60 days supply | Qty: 12 | Fill #1

## 2016-09-01 MED FILL — valACYclovir HCL 1 GM TABS: 1 | 30 days supply | Qty: 30 | Fill #3

## 2016-09-01 NOTE — Patient Instructions (Addendum)
Use nothing with fragrance. Take an OTC Zyrtec to help with itching. Apply foam as directed.   Thank you for coming in today. I hope you feel we met your needs.  Feel free to call [facility] if you have any questions or further requests.  Please consider signing up for MyChart if you do not already have it, as this is a great way to communicate with me.  Best,  Whitney McVey, PA-C    IF you received an x-ray today, you will receive an invoice from [facility]. Please contact [facility] at [phone] with questions or concerns regarding your invoice.   IF you received labwork today, you will receive an invoice from [facility]. Please contact [facility] at [phone] with questions or concerns regarding your invoice.   Our billing staff will not be able to assist you with questions regarding bills from these companies.  You will be contacted with the lab results as soon as they are available. The fastest way to get your results is to activate your My Chart account. Instructions are located on the last page of this paperwork. If you have not heard from [place] regarding the results in 2 weeks, please contact this office.

## 2016-09-01 NOTE — Progress Notes (Signed)
   Dale Odom  MRN: [id number] DOB: Jun 21, 1971  PCP: Maximino Greenland, MD  Subjective:  Pt is a 45 year old male who presents to clinic for rash on face.  He used a dye to try and color his beard a few days ago and noticed a rash in the area where he applied the dye. His rash is now spreading down his neck and up the side of his face. Mild itchiness. Minor pain.  Denies SOB, wheezing, burning, abdominal pain, chest pain.   Review of Systems  Constitutional: Negative for chills and diaphoresis.  Respiratory: Negative for cough, chest tightness, shortness of breath and wheezing.   Cardiovascular: Negative for chest pain, palpitations and leg swelling.  Gastrointestinal: Negative for diarrhea, nausea and vomiting.  Skin: Positive for rash.  Neurological: Negative for dizziness, syncope, light-headedness and headaches.  Psychiatric/Behavioral: Negative for sleep disturbance. The patient is not nervous/anxious.     There are no active problems to display for this patient.   Current Outpatient Prescriptions on File Prior to Visit  Medication Sig Dispense Refill  . LISINOPRIL-HYDROCHLOROTHIAZIDE PO Take by mouth.      . Naftifine HCl 2 % CREA APPLY TO AFFECTED AREA(S) DAILY 60 g 2  . triamcinolone cream (KENALOG) 0.1 % Apply 1 application topically 2 (two) times daily. 30 g 0   No current facility-administered medications on file prior to visit.     No Known Allergies   Objective:  BP 138/88 (BP Location: Right Arm, Patient Position: Sitting, Cuff Size: Small)   Pulse 91   Temp 97.9 F (36.6 C) (Oral)   Resp 18   Ht 6\' 1"  (1.854 m)   Wt 232 lb (105.2 kg)   SpO2 97%   BMI 30.61 kg/m   Physical Exam  Constitutional: He is oriented to person, place, and time and well-developed, well-nourished, and in no distress. No distress.  Cardiovascular: Normal rate, regular rhythm and normal heart sounds.   Pulmonary/Chest: Effort normal. No respiratory distress.  Neurological: He is  alert and oriented to person, place, and time. GCS score is 15.  Skin: Skin is warm and dry. Rash (entire chin and neck, spread to involve distal neck) noted. Rash is maculopapular and urticarial.  Psychiatric: Mood, memory, affect and judgment normal.  Vitals reviewed.   Assessment and Plan :  1. Allergic dermatitis 2. Rash and nonspecific skin eruption - desonide (VERDESO) 0.05 % foam; Apply topically 2 (two) times daily.  Dispense: 100 g; Refill: 0 - methylPREDNISolone acetate (DEPO-MEDROL) injection 80 mg; Inject 1 mL (80 mg total) into the muscle once. - RTC if no improvement.   Mercer Pod, PA-C  [facility] 09/01/2016 3:26 PM

## 2016-09-13 DIAGNOSIS — N183 Chronic kidney disease, stage 3 (moderate): Secondary | ICD-10-CM | POA: Diagnosis not present

## 2016-09-13 DIAGNOSIS — Z79899 Other long term (current) drug therapy: Secondary | ICD-10-CM | POA: Diagnosis not present

## 2016-09-13 DIAGNOSIS — R7309 Other abnormal glucose: Secondary | ICD-10-CM | POA: Diagnosis not present

## 2016-09-13 DIAGNOSIS — I129 Hypertensive chronic kidney disease with stage 1 through stage 4 chronic kidney disease, or unspecified chronic kidney disease: Secondary | ICD-10-CM | POA: Diagnosis not present

## 2016-10-28 MED FILL — VALACYCLOVIR HCL 500 MG TAB: 500 | 90 days supply | Qty: 180 | Fill #0

## 2016-12-15 MED FILL — LISINOPRIL-HCTZ 20-25 MG TA: 20-25 | 90 days supply | Qty: 90 | Fill #0

## 2016-12-15 MED FILL — ROSUVASTATIN CALCIUM 40 MG: 40 | 90 days supply | Qty: 90 | Fill #0

## 2016-12-24 MED FILL — IBUPROFEN 600 MG TABLET: 600 | 5 days supply | Qty: 20 | Fill #0

## 2016-12-24 MED FILL — HYDROCODON-APAP 5-325: 5-325 | 3 days supply | Qty: 20 | Fill #0

## 2017-01-04 DIAGNOSIS — R8279 Other abnormal findings on microbiological examination of urine: Secondary | ICD-10-CM | POA: Diagnosis not present

## 2017-01-04 DIAGNOSIS — N5201 Erectile dysfunction due to arterial insufficiency: Secondary | ICD-10-CM | POA: Diagnosis not present

## 2017-01-04 DIAGNOSIS — N3943 Post-void dribbling: Secondary | ICD-10-CM | POA: Diagnosis not present

## 2017-01-04 MED FILL — DOXYCYCLINE HYC 100 MG TAB: 100 | 10 days supply | Qty: 20 | Fill #0

## 2017-02-01 DIAGNOSIS — Z720 Tobacco use: Secondary | ICD-10-CM | POA: Diagnosis not present

## 2017-02-01 DIAGNOSIS — N183 Chronic kidney disease, stage 3 (moderate): Secondary | ICD-10-CM | POA: Diagnosis not present

## 2017-02-01 DIAGNOSIS — N2581 Secondary hyperparathyroidism of renal origin: Secondary | ICD-10-CM | POA: Diagnosis not present

## 2017-02-01 DIAGNOSIS — E669 Obesity, unspecified: Secondary | ICD-10-CM | POA: Diagnosis not present

## 2017-02-01 DIAGNOSIS — R7989 Other specified abnormal findings of blood chemistry: Secondary | ICD-10-CM | POA: Diagnosis not present

## 2017-03-30 DIAGNOSIS — Z0000 Encounter for general adult medical examination without abnormal findings: Secondary | ICD-10-CM | POA: Diagnosis not present

## 2017-03-30 DIAGNOSIS — Z720 Tobacco use: Secondary | ICD-10-CM | POA: Diagnosis not present

## 2017-03-30 DIAGNOSIS — I129 Hypertensive chronic kidney disease with stage 1 through stage 4 chronic kidney disease, or unspecified chronic kidney disease: Secondary | ICD-10-CM | POA: Diagnosis not present

## 2017-03-30 DIAGNOSIS — Z1212 Encounter for screening for malignant neoplasm of rectum: Secondary | ICD-10-CM | POA: Diagnosis not present

## 2017-03-30 DIAGNOSIS — N183 Chronic kidney disease, stage 3 (moderate): Secondary | ICD-10-CM | POA: Diagnosis not present

## 2017-04-07 MED FILL — VIT D2 1.25 MG (50,000 UNIT: 1.25 MG | 28 days supply | Qty: 8 | Fill #0

## 2017-04-11 MED FILL — LISINOPRIL-HCTZ 20-25 MG TA: 20-25 | 90 days supply | Qty: 90 | Fill #1

## 2017-04-11 MED FILL — VALACYCLOVIR HCL 500 MG TAB: 500 | 90 days supply | Qty: 180 | Fill #0

## 2017-04-11 MED FILL — ROSUVASTATIN CALCIUM 40 MG: 40 | 90 days supply | Qty: 90 | Fill #1

## 2017-04-27 MED FILL — SILDENAFIL 20 MG TABLET: 20 | 18 days supply | Qty: 90 | Fill #0

## 2017-07-27 MED FILL — SILDENAFIL 20 MG TABLET: 20 | 18 days supply | Qty: 90 | Fill #1

## 2017-08-16 MED FILL — LISINOPRIL-HCTZ 20-25 MG TA: 20-25 | 90 days supply | Qty: 90 | Fill #2

## 2017-08-16 MED FILL — ROSUVASTATIN CALCIUM 40 MG: 40 | 90 days supply | Qty: 90 | Fill #2

## 2017-10-03 DIAGNOSIS — Z79899 Other long term (current) drug therapy: Secondary | ICD-10-CM | POA: Diagnosis not present

## 2017-10-03 DIAGNOSIS — R7309 Other abnormal glucose: Secondary | ICD-10-CM | POA: Diagnosis not present

## 2017-10-03 DIAGNOSIS — I129 Hypertensive chronic kidney disease with stage 1 through stage 4 chronic kidney disease, or unspecified chronic kidney disease: Secondary | ICD-10-CM | POA: Diagnosis not present

## 2017-10-03 DIAGNOSIS — N183 Chronic kidney disease, stage 3 (moderate): Secondary | ICD-10-CM | POA: Diagnosis not present

## 2017-10-19 MED FILL — SILDENAFIL 20 MG TABLET: 20 | 18 days supply | Qty: 90 | Fill #2

## 2017-10-28 DIAGNOSIS — H524 Presbyopia: Secondary | ICD-10-CM | POA: Diagnosis not present

## 2017-12-27 MED FILL — VALACYCLOVIR HCL 500 MG TAB: 500 | 90 days supply | Qty: 180 | Fill #1

## 2017-12-27 MED FILL — LISINOPRIL-HCTZ 20-25 MG TA: 20-25 | 90 days supply | Qty: 90 | Fill #0

## 2017-12-27 MED FILL — SILDENAFIL 20 MG TABLET: 20 | 18 days supply | Qty: 90 | Fill #3

## 2017-12-27 MED FILL — ROSUVASTATIN CALCIUM 40 MG: 40 | 90 days supply | Qty: 90 | Fill #0

## 2018-01-31 DIAGNOSIS — E669 Obesity, unspecified: Secondary | ICD-10-CM | POA: Diagnosis not present

## 2018-01-31 DIAGNOSIS — N183 Chronic kidney disease, stage 3 (moderate): Secondary | ICD-10-CM | POA: Diagnosis not present

## 2018-01-31 DIAGNOSIS — I129 Hypertensive chronic kidney disease with stage 1 through stage 4 chronic kidney disease, or unspecified chronic kidney disease: Secondary | ICD-10-CM | POA: Diagnosis not present

## 2018-01-31 DIAGNOSIS — R7309 Other abnormal glucose: Secondary | ICD-10-CM | POA: Diagnosis not present

## 2018-02-08 MED FILL — SILDENAFIL 20 MG TABLET: 20 | 18 days supply | Qty: 90 | Fill #4

## 2018-04-08 ENCOUNTER — Encounter (HOSPITAL_COMMUNITY): Payer: Self-pay

## 2018-04-08 ENCOUNTER — Ambulatory Visit (HOSPITAL_COMMUNITY)
Admission: EM | Admit: 2018-04-08 | Discharge: 2018-04-08 | Disposition: A | Discharge status: Home / Self Care | Payer: 59 | Attending: Family Medicine | Admitting: Family Medicine

## 2018-04-08 ENCOUNTER — Other Ambulatory Visit: Payer: Self-pay

## 2018-04-08 DIAGNOSIS — T63441A Toxic effect of venom of bees, accidental (unintentional), initial encounter: Secondary | ICD-10-CM

## 2018-04-08 DIAGNOSIS — T7840XA Allergy, unspecified, initial encounter: Secondary | ICD-10-CM

## 2018-04-08 MED ORDER — METHYLPREDNISOLONE SODIUM SUCC 125 MG IJ SOLR
INTRAMUSCULAR | Status: AC
  Filled 2018-04-08: qty 2

## 2018-04-08 MED ORDER — METHYLPREDNISOLONE SODIUM SUCC 125 MG IJ SOLR
125.0000 mg | Freq: Once | INTRAMUSCULAR | Status: AC
  Administered 2018-04-08: 125 mg via INTRAMUSCULAR

## 2018-04-08 MED ORDER — PREDNISONE 10 MG (21) PO TBPK: ORAL_TABLET | Freq: Every day | ORAL | 0 refills | Status: DC

## 2018-04-08 NOTE — ED Notes (Signed)
Pt discharged by provider.

## 2018-04-08 NOTE — ED Triage Notes (Signed)
Pt presents to [facility] for bee sting to mouth since today, pt has removed stinger, pt is having facial swelling, pt has taken Benadryl and Claritin

## 2018-04-08 NOTE — Discharge Instructions (Signed)
Meds ordered this encounter  Medications   methylPREDNISolone sodium succinate (SOLU-MEDROL) 125 mg/2 mL injection 125 mg   predniSONE (STERAPRED UNI-PAK 21 TAB) 10 MG (21) TBPK tablet    Sig: Take by mouth daily. Take as directed.    Dispense:  21 tablet    Refill:  0    

## 2018-04-10 NOTE — ED Provider Notes (Signed)
[facility]   [id number] 04/08/18 Arrival Time: 2536  ASSESSMENT & PLAN:  1. Bee sting, accidental or unintentional, initial encounter   2. Allergic reaction, initial encounter     Meds ordered this encounter  Medications  . methylPREDNISolone sodium succinate (SOLU-MEDROL) 125 mg/2 mL injection 125 mg  . predniSONE (STERAPRED UNI-PAK 21 TAB) 10 MG (21) TBPK tablet    Sig: Take by mouth daily. Take as directed.    Dispense:  21 tablet    Refill:  0   Benadryl if needed.  Follow-up Information    [facility].   Specialty:  Emergency Medicine Why:  If symptoms worsen. Contact information: [address] [id number] [place] [phone]         Reviewed expectations re: course of current medical issues. Questions answered. Outlined signs and symptoms indicating need for more acute intervention. Patient verbalized understanding. After Visit Summary given.   SUBJECTIVE:  Dale Odom is a 47 y.o. male who reports being stung by a bee. Today. Lower lip. He removed stinger. Immediate lip swelling without swelling of tongue. No respiratory or swallowing difficulties. Afebrile. No n/v. Took Benadryl 50mg ; feels this is helping. No abdominal pain. No rashes. No specific aggravating factors reported. Ambulatory without assistance. No h/o severe reaction to bee stings.  ROS: As per HPI.  OBJECTIVE: Vitals:   04/08/18 1552  BP: 134/90  Pulse: 77  Resp: 17  Temp: 98.5 F (36.9 C)  TempSrc: Oral  SpO2: 99%    General appearance: alert; no distress Oropharynx: tongue normal; without swelling Lungs: clear to auscultation bilaterally Heart: regular rate and rhythm Extremities: no edema Skin: warm and dry; lower lip is quite swollen along with mild facial swelling in general; no rashes or diaphoresis Psychological: alert and cooperative; normal mood and affect  No Known  Allergies  Past Medical History:  Diagnosis Date  . Hypertension    Social History   Socioeconomic History  . Marital status: Married    Spouse name: Not on file  . Number of children: Not on file  . Years of education: Not on file  . Highest education level: Not on file  Occupational History  . Not on file  Social Needs  . Financial resource strain: Not on file  . Food insecurity:    Worry: Not on file    Inability: Not on file  . Transportation needs:    Medical: Not on file    Non-medical: Not on file  Tobacco Use  . Smoking status: Current Every Day Smoker    Packs/day: 1.00    Types: Cigarettes  . Smokeless tobacco: Never Used  Substance and Sexual Activity  . Alcohol use: No  . Drug use: No  . Sexual activity: Yes    Birth control/protection: Surgical  Lifestyle  . Physical activity:    Days per week: Not on file    Minutes per session: Not on file  . Stress: Not on file  Relationships  . Social connections:    Talks on phone: Not on file    Gets together: Not on file    Attends religious service: Not on file    Active member of club or organization: Not on file    Attends meetings of clubs or organizations: Not on file    Relationship status: Not on file  . Intimate partner violence:    Fear of current or ex partner: Not on file  Emotionally abused: Not on file    Physically abused: Not on file    Forced sexual activity: Not on file  Other Topics Concern  . Not on file  Social History Narrative  . Not on file   History reviewed. No pertinent family history. Past Surgical History:  Procedure Laterality Date  . HERNIA REPAIR    . Lady Deutscher, MD 04/10/18 [phone]

## 2018-04-26 DIAGNOSIS — R7989 Other specified abnormal findings of blood chemistry: Secondary | ICD-10-CM | POA: Diagnosis not present

## 2018-04-26 DIAGNOSIS — E669 Obesity, unspecified: Secondary | ICD-10-CM | POA: Diagnosis not present

## 2018-04-26 DIAGNOSIS — D631 Anemia in chronic kidney disease: Secondary | ICD-10-CM | POA: Diagnosis not present

## 2018-04-26 DIAGNOSIS — Z720 Tobacco use: Secondary | ICD-10-CM | POA: Diagnosis not present

## 2018-04-26 DIAGNOSIS — N183 Chronic kidney disease, stage 3 (moderate): Secondary | ICD-10-CM | POA: Diagnosis not present

## 2018-04-26 DIAGNOSIS — N2581 Secondary hyperparathyroidism of renal origin: Secondary | ICD-10-CM | POA: Diagnosis not present

## 2018-05-05 MED FILL — LISINOPRIL-HCTZ 20-25 MG TA: 20-25 | 90 days supply | Qty: 90 | Fill #1

## 2018-05-05 MED FILL — ROSUVASTATIN CALCIUM 40 MG: 40 | 90 days supply | Qty: 90 | Fill #1

## 2018-05-11 MED FILL — SILDENAFIL CITRATE 20 MG TA: 20 | 18 days supply | Qty: 90 | Fill #0

## 2018-05-17 DIAGNOSIS — R3121 Asymptomatic microscopic hematuria: Secondary | ICD-10-CM | POA: Diagnosis not present

## 2018-05-17 DIAGNOSIS — R3915 Urgency of urination: Secondary | ICD-10-CM | POA: Diagnosis not present

## 2018-05-17 DIAGNOSIS — N401 Enlarged prostate with lower urinary tract symptoms: Secondary | ICD-10-CM | POA: Diagnosis not present

## 2018-05-30 DIAGNOSIS — R3915 Urgency of urination: Secondary | ICD-10-CM | POA: Diagnosis not present

## 2018-05-30 DIAGNOSIS — R3121 Asymptomatic microscopic hematuria: Secondary | ICD-10-CM | POA: Diagnosis not present

## 2018-06-12 MED FILL — SILDENAFIL CITRATE 20 MG TA: 20 | 18 days supply | Qty: 90 | Fill #1

## 2018-06-26 ENCOUNTER — Ambulatory Visit: Payer: 59 | Admitting: Internal Medicine

## 2018-06-26 ENCOUNTER — Encounter: Payer: Self-pay | Admitting: Internal Medicine

## 2018-06-26 VITALS — BP 108/76 | HR 71 | Temp 98.0°F | Ht 70.5 in | Wt 232.8 lb

## 2018-06-26 DIAGNOSIS — N183 Chronic kidney disease, stage 3 unspecified: Secondary | ICD-10-CM

## 2018-06-26 DIAGNOSIS — I129 Hypertensive chronic kidney disease with stage 1 through stage 4 chronic kidney disease, or unspecified chronic kidney disease: Secondary | ICD-10-CM

## 2018-06-26 DIAGNOSIS — Z0000 Encounter for general adult medical examination without abnormal findings: Secondary | ICD-10-CM

## 2018-06-26 LAB — HEMOGLOBIN A1C
Est. average glucose Bld gHb Est-mCnc: 123 mg/dL
Hgb A1c MFr Bld: 5.9 % — ABNORMAL HIGH (ref 4.8–5.6)

## 2018-06-26 LAB — CMP14+EGFR
ALT: 30 IU/L (ref 0–44)
AST: 28 IU/L (ref 0–40)
Albumin/Globulin Ratio: 1.8 (ref 1.2–2.2)
Albumin: 4.4 g/dL (ref 3.5–5.5)
Alkaline Phosphatase: 71 IU/L (ref 39–117)
BUN/Creatinine Ratio: 9 (ref 9–20)
BUN: 16 mg/dL (ref 6–24)
Bilirubin Total: 0.3 mg/dL (ref 0.0–1.2)
CO2: 24 mmol/L (ref 20–29)
Calcium: 9.9 mg/dL (ref 8.7–10.2)
Chloride: 99 mmol/L (ref 96–106)
Creatinine, Ser: 1.69 mg/dL — ABNORMAL HIGH (ref 0.76–1.27)
GFR calc Af Amer: 54 mL/min/{1.73_m2} — ABNORMAL LOW (ref >59)
GFR calc non Af Amer: 47 mL/min/{1.73_m2} — ABNORMAL LOW (ref >59)
Globulin, Total: 2.4 g/dL (ref 1.5–4.5)
Glucose: 89 mg/dL (ref 65–99)
Potassium: 4.3 mmol/L (ref 3.5–5.2)
Sodium: 140 mmol/L (ref 134–144)
Total Protein: 6.8 g/dL (ref 6.0–8.5)

## 2018-06-26 LAB — POCT URINALYSIS DIPSTICK
Bilirubin, UA: NEGATIVE
Blood, UA: NEGATIVE
Glucose, UA: NEGATIVE
Ketones, UA: NEGATIVE
Leukocytes, UA: NEGATIVE
Nitrite, UA: NEGATIVE
Protein, UA: POSITIVE — AB
Spec Grav, UA: 1.025 (ref 1.010–1.025)
Urobilinogen, UA: 0.2 E.U./dL
pH, UA: 6.5 (ref 5.0–8.0)

## 2018-06-26 LAB — CBC
Hematocrit: 42.4 % (ref 37.5–51.0)
Hemoglobin: 14.7 g/dL (ref 13.0–17.7)
MCH: 29.5 pg (ref 26.6–33.0)
MCHC: 34.7 g/dL (ref 31.5–35.7)
MCV: 85 fL (ref 79–97)
Platelets: 212 10*3/uL (ref 150–450)
RBC: 4.98 x10E6/uL (ref 4.14–5.80)
RDW: 13.9 % (ref 12.3–15.4)
WBC: 5.5 10*3/uL (ref 3.4–10.8)

## 2018-06-26 LAB — LIPID PANEL
Chol/HDL Ratio: 4.2 ratio (ref 0.0–5.0)
Cholesterol, Total: 176 mg/dL (ref 100–199)
HDL: 42 mg/dL (ref >39)
LDL Calculated: 102 mg/dL — ABNORMAL HIGH (ref 0–99)
Triglycerides: 158 mg/dL — ABNORMAL HIGH (ref 0–149)
VLDL Cholesterol Cal: 32 mg/dL (ref 5–40)

## 2018-06-26 NOTE — Progress Notes (Signed)
Men's preventive visit. Patient Health Questionnaire (PHQ-2) is    Office Visit from 06/26/2018 in [facility]  PHQ-2 Total Score  0    . Patient is on a low-sodium diet. Marital status: Married. Relevant history for alcohol use is:  Social History   Substance and Sexual Activity  Alcohol Use No  . Relevant history for tobacco use is:  Social History   Tobacco Use  Smoking Status Current Every Day Smoker  . Packs/day: 0.25  . Types: Cigarettes  Smokeless Tobacco Never Used  .  Subjective:     Patient ID: Dale Odom , male    DOB: Mar 17, 1971 , 47 y.o.   MRN: [id number]   He is here today for a full physical examination. He has no specific concerns or complaints at this time.   Hypertension  This is a chronic problem. The current episode started more than 1 year ago. The problem has been gradually improving since onset. Pertinent negatives include no blurred vision, chest pain, headaches, orthopnea, palpitations or shortness of breath. Risk factors for coronary artery disease include obesity and sedentary lifestyle. The current treatment provides significant improvement.   He reports compliance with meds.   Past Medical History:  Diagnosis Date  . Hypertension       Current Outpatient Medications:  .  lisinopril-hydrochlorothiazide (PRINZIDE,ZESTORETIC) 20-25 MG tablet, Take 1 tablet by mouth daily., Disp: , Rfl:  .  rosuvastatin (CRESTOR) 40 MG tablet, Take 40 mg by mouth daily., Disp: , Rfl:  .  valACYclovir (VALTREX) 500 MG tablet, Take 500 mg by mouth 2 (two) times daily., Disp: , Rfl:    No Known Allergies   Review of Systems  Constitutional: Negative.   HENT: Negative.   Eyes: Negative.  Negative for blurred vision.  Respiratory: Negative.  Negative for shortness of breath.   Cardiovascular: Negative.  Negative for chest pain, palpitations and orthopnea.  Endocrine: Negative.   Neurological: Negative.  Negative for headaches.   Hematological: Negative.   Psychiatric/Behavioral: Negative.      Today's Vitals   06/26/18 0950  BP: 108/76  Pulse: 71  Temp: 98 F (36.7 C)  Weight: 232 lb 12.8 oz (105.6 kg)  Height: 5' 10.5" (1.791 m)   Body mass index is 32.93 kg/m.   Objective:  Physical Exam  Constitutional: He is oriented to person, place, and time. He appears well-developed and well-nourished.  HENT:  Head: Normocephalic and atraumatic.  Right Ear: External ear normal.  Left Ear: External ear normal.  Nose: Nose normal.  Mouth/Throat: Oropharynx is clear and moist.  Eyes: Pupils are equal, round, and reactive to light. Conjunctivae and EOM are normal.  Neck: Normal range of motion. Neck supple.  Cardiovascular: Normal rate, regular rhythm, normal heart sounds and intact distal pulses.  Pulmonary/Chest: Effort normal and breath sounds normal. He exhibits no mass and no tenderness. Right breast exhibits no inverted nipple, no mass, no nipple discharge and no skin change. Left breast exhibits no inverted nipple, no mass, no nipple discharge and no skin change.  Abdominal: Soft. Bowel sounds are normal.  Genitourinary:  Genitourinary Comments: Deferred per patient   Musculoskeletal: Normal range of motion.  Neurological: He is alert and oriented to person, place, and time.  Skin: Skin is warm and dry.  Nursing note and vitals reviewed.       Assessment And Plan:     1. Routine general medical examination at health care facility  A full exam was performed.  DRE not performed per patients request. He is followed by Urology.  HE IS ADVISED TO GET 30-45 MINUTES REGULAR EXERCISE NO LESS THAN FOUR TO FIVE DAYS PER WEEK - BOTH WEIGHTBEARING EXERCISES AND AEROBIC ARE RECOMMENDED.  HE IS ADVISED TO FOLLOW A HEALTHY DIET WITH AT LEAST SIX FRUITS/VEGGIES PER DAY, DECREASE INTAKE OF RED MEAT, AND TO INCREASE FISH INTAKE TO TWO DAYS PER WEEK.  MEATS/FISH SHOULD NOT BE FRIED, BAKED OR BROILED IS PREFERABLE.  I  SUGGEST WEARING SPF 50 SUNSCREEN ON EXPOSED PARTS AND ESPECIALLY WHEN IN THE DIRECT SUNLIGHT FOR AN EXTENDED PERIOD OF TIME.  PLEASE AVOID FAST FOOD RESTAURANTS AND INCREASE YOUR WATER INTAKE.  - CBC no Diff - CMP14+EGFR - Hemoglobin A1c - Lipid Profile  2. Hypertensive nephropathy  WELL CONTROLLED. HE WILL CONTINUE WITH CURRENT MEDS. EKG PERFORMED, NSR W/O ACUTE CHANGES. HE IS ENCOURAGED TO LIMIT HIS SALT INTAKE AND TO INCORPORATE MORE EXERCISE INTO HIS DAILY ROUTINE. HE WILL RTO IN SIX MONTHS FOR RE-EVALUATION.   - EKG 12-Lead  3. Chronic renal disease, stage III (HCC)  CHRONIC. HE IS ENCOURAGED TO STAY WELL HYDRATED.  Maximino Greenland, MD

## 2018-06-27 NOTE — Progress Notes (Signed)
Your blood count is normal.  Your kidney function is stable. Your liver function is normal. You are prediabetic with an a1c 5.9. Normal is less than 5.6, please avoid sugary beverages. I also suggest incorporating more exercise into your daily routine. Your cholesterol is fairly good. However, your triglycerides are elevated, pls cut back on your intake of processed foods including white breads, rice and pasta.

## 2018-07-20 MED FILL — SILDENAFIL CITRATE 20 MG TA: 20 | 18 days supply | Qty: 90 | Fill #2

## 2018-08-22 MED FILL — OXYBUTYNIN CL ER 10 MG TAB: 10 | 30 days supply | Qty: 30 | Fill #0

## 2018-08-22 MED FILL — SILDENAFIL CITRATE 20 MG TA: 20 | 18 days supply | Qty: 90 | Fill #3

## 2018-09-13 MED FILL — SILDENAFIL CITRATE 20 MG TA: 20 | 18 days supply | Qty: 90 | Fill #4

## 2018-09-13 MED FILL — LISINOPRIL-HCTZ 20-25 MG TA: 20-25 | 90 days supply | Qty: 90 | Fill #2

## 2018-09-13 MED FILL — ROSUVASTATIN CALCIUM 40 MG: 40 | 90 days supply | Qty: 90 | Fill #2

## 2018-10-06 MED FILL — OXYBUTYNIN CL ER 10 MG TAB: 10 | 30 days supply | Qty: 30 | Fill #1

## 2018-10-06 MED FILL — SILDENAFIL CITRATE 20 MG TA: 20 | 18 days supply | Qty: 90 | Fill #5

## 2018-11-06 ENCOUNTER — Other Ambulatory Visit: Payer: Self-pay | Admitting: Internal Medicine

## 2018-11-06 MED FILL — SILDENAFIL CITRATE 20 MG TA: 20 | 18 days supply | Qty: 90 | Fill #6

## 2018-11-16 MED FILL — VALACYCLOVIR HCL 500 MG TAB: 500 | 90 days supply | Qty: 180 | Fill #0

## 2018-11-29 MED FILL — SILDENAFIL CITRATE 20 MG TA: 20 | 18 days supply | Qty: 90 | Fill #7

## 2018-11-29 MED FILL — LISINOPRIL-HCTZ 20-25 MG TA: 20-25 | 90 days supply | Qty: 90 | Fill #0

## 2018-11-29 MED FILL — OXYBUTYNIN CL ER 10 MG TAB: 10 | 30 days supply | Qty: 30 | Fill #2

## 2018-11-29 MED FILL — ROSUVASTATIN CALCIUM 40 MG: 40 | 90 days supply | Qty: 90 | Fill #0

## 2018-12-25 ENCOUNTER — Ambulatory Visit: Payer: 59 | Admitting: Internal Medicine

## 2018-12-25 ENCOUNTER — Encounter: Payer: Self-pay | Admitting: Internal Medicine

## 2018-12-25 ENCOUNTER — Other Ambulatory Visit: Payer: Self-pay

## 2018-12-25 VITALS — BP 116/88 | HR 74 | Temp 98.0°F | Ht 70.5 in | Wt 233.6 lb

## 2018-12-25 DIAGNOSIS — E6609 Other obesity due to excess calories: Secondary | ICD-10-CM | POA: Diagnosis not present

## 2018-12-25 DIAGNOSIS — R7303 Prediabetes: Secondary | ICD-10-CM

## 2018-12-25 DIAGNOSIS — N183 Chronic kidney disease, stage 3 unspecified: Secondary | ICD-10-CM | POA: Insufficient documentation

## 2018-12-25 DIAGNOSIS — R252 Cramp and spasm: Secondary | ICD-10-CM | POA: Diagnosis not present

## 2018-12-25 DIAGNOSIS — Z6833 Body mass index (BMI) 33.0-33.9, adult: Secondary | ICD-10-CM

## 2018-12-25 DIAGNOSIS — E7800 Pure hypercholesterolemia, unspecified: Secondary | ICD-10-CM | POA: Diagnosis not present

## 2018-12-25 DIAGNOSIS — I129 Hypertensive chronic kidney disease with stage 1 through stage 4 chronic kidney disease, or unspecified chronic kidney disease: Secondary | ICD-10-CM | POA: Diagnosis not present

## 2018-12-25 MED ORDER — OLMESARTAN MEDOXOMIL 20 MG PO TABS: 20.0000 mg | ORAL_TABLET | Freq: Every day | ORAL | 1 refills | Status: DC

## 2018-12-25 MED FILL — OLMESARTAN MEDOXOMIL 20 MG: 20 | 30 days supply | Qty: 30 | Fill #0

## 2018-12-25 NOTE — Progress Notes (Signed)
Subjective:     Patient ID: Dale Odom , male    DOB: [birth year] , 48 y.o.   MRN: [id number]   Chief Complaint  Patient presents with  . Hypertension  . Hyperlipidemia    HPI  Hypertension  This is a chronic problem. The current episode started more than 1 year ago. The problem has been gradually improving since onset. The problem is controlled. Pertinent negatives include no blurred vision, chest pain, palpitations or shortness of breath. Risk factors for coronary artery disease include dyslipidemia, male gender and sedentary lifestyle. Hypertensive end-organ damage includes kidney disease.     Past Medical History:  Diagnosis Date  . Hypertension      Family History  Problem Relation Age of Onset  . Hypertension Mother   . Hyperlipidemia Mother   . Heart Problems Father   . Prostate cancer Father      Current Outpatient Medications:  .  lisinopril-hydrochlorothiazide (PRINZIDE,ZESTORETIC) 20-25 MG tablet, Take 1 tablet by mouth daily., Disp: , Rfl:  .  rosuvastatin (CRESTOR) 40 MG tablet, Take 40 mg by mouth daily., Disp: , Rfl:  .  valACYclovir (VALTREX) 500 MG tablet, TAKE 1 TABLET BY MOUTH TWICE DAILY, Disp: 180 tablet, Rfl: 2   No Known Allergies   Review of Systems  Constitutional: Negative.   Eyes: Negative for blurred vision.  Respiratory: Negative.  Negative for shortness of breath.   Cardiovascular: Negative.  Negative for chest pain and palpitations.  Gastrointestinal: Negative.   Musculoskeletal: Positive for myalgias (he c/o muscle cramps. Wants to change bp meds. Unable to determine what triggers his sx. He thinks it is due to bp ).  Neurological: Negative.   Psychiatric/Behavioral: Negative.      Today's Vitals   12/25/18 0851  BP: 116/88  Pulse: 74  Temp: 98 F (36.7 C)  TempSrc: Oral  Weight: 233 lb 9.6 oz (106 kg)  Height: 5' 10.5" (1.791 m)   Body mass index is 33.04 kg/m.   Objective:  Physical Exam Vitals signs and nursing  note reviewed.  Constitutional:      Appearance: Normal appearance.  HENT:     Head: Normocephalic and atraumatic.  Cardiovascular:     Rate and Rhythm: Normal rate and regular rhythm.     Heart sounds: Normal heart sounds.  Pulmonary:     Effort: Pulmonary effort is normal.     Breath sounds: Normal breath sounds.  Skin:    General: Skin is warm.  Neurological:     General: No focal deficit present.     Mental Status: He is alert.  Psychiatric:        Mood and Affect: Mood normal.         Assessment And Plan:     1. Hypertensive nephropathy  Fair control. He does not wish to continue with current meds d/t muscle cramps. He feels based on his research that it is the hctz contributing to his symptoms.  He wants to try a different medication. I will d/c lisinopril/hctz. I will switch him to olmesartan 20mg  once daily. He was made aware that he will likely need a second medication to control his symptoms. He agrees to rto in four weeks for re-evaluation.   2. Chronic renal disease, stage III (HCC)  Chronic. He is also followed by Renal.   3. Pure hypercholesterolemia  I will check a fasting lipid panel.  He is encouraged to incorporate more exercise into his daily routine. He is also reminded that  he is on a statin which could be causing his muscle cramps.   4. Muscle cramps  He is encouraged to start taking magnesium once daily.   5. Class 1 obesity due to excess calories with serious comorbidity and body mass index (BMI) of 33.0 to 33.9 in adult  Importance of achieving optimal weight to decrease risk of cardiovascular disease and cancers was discussed with the patient in full detail. He is encouraged to start slowly - start with 10 minutes twice daily at least three to four days per week and to gradually build to 30 minutes five days weekly. He was given tips to incorporate more activity into her daily routine - take stairs when possible, park farther away from grocery  stores, etc.      Maximino Greenland, MD    THE PATIENT IS ENCOURAGED TO PRACTICE SOCIAL DISTANCING DUE TO THE COVID-19 PANDEMIC.

## 2018-12-25 NOTE — Patient Instructions (Signed)
e Exercising to Lose Weight Exercise is structured, repetitive physical activity to improve fitness and health. Getting regular exercise is important for everyone. It is especially important if you are overweight. Being overweight increases your risk of heart disease, stroke, diabetes, high blood pressure, and several types of cancer. Reducing your calorie intake and exercising can help you lose weight. Exercise is usually categorized as moderate or vigorous intensity. To lose weight, most people need to do a certain amount of moderate-intensity or vigorous-intensity exercise each week. Moderate-intensity exercise  Moderate-intensity exercise is any activity that gets you moving enough to burn at least three times more energy (calories) than if you were sitting. Examples of moderate exercise include:  Walking a mile in 15 minutes.  Doing light yard work.  Biking at an easy pace. Most people should get at least 150 minutes (2 hours and 30 minutes) a week of moderate-intensity exercise to maintain their body weight. Vigorous-intensity exercise Vigorous-intensity exercise is any activity that gets you moving enough to burn at least six times more calories than if you were sitting. When you exercise at this intensity, you should be working hard enough that you are not able to carry on a conversation. Examples of vigorous exercise include:  Running.  Playing a team sport, such as football, basketball, and soccer.  Jumping rope. Most people should get at least 75 minutes (1 hour and 15 minutes) a week of vigorous-intensity exercise to maintain their body weight. How can exercise affect me? When you exercise enough to burn more calories than you eat, you lose weight. Exercise also reduces body fat and builds muscle. The more muscle you have, the more calories you burn. Exercise also:  Improves mood.  Reduces stress and tension.  Improves your overall fitness, flexibility, and endurance.   Increases bone strength. The amount of exercise you need to lose weight depends on:  Your age.  The type of exercise.  Any health conditions you have.  Your overall physical ability. Talk to your health care provider about how much exercise you need and what types of activities are safe for you. What actions can I take to lose weight? Nutrition   Make changes to your diet as told by your health care provider or diet and nutrition specialist (dietitian). This may include: ? Eating fewer calories. ? Eating more protein. ? Eating less unhealthy fats. ? Eating a diet that includes fresh fruits and vegetables, whole grains, low-fat dairy products, and lean protein. ? Avoiding foods with added fat, salt, and sugar.  Drink plenty of water while you exercise to prevent dehydration or heat stroke. Activity  Choose an activity that you enjoy and set realistic goals. Your health care provider can help you make an exercise plan that works for you.  Exercise at a moderate or vigorous intensity most days of the week. ? The intensity of exercise may vary from person to person. You can tell how intense a workout is for you by paying attention to your breathing and heartbeat. Most people will notice their breathing and heartbeat get faster with more intense exercise.  Do resistance training twice each week, such as: ? Push-ups. ? Sit-ups. ? Lifting weights. ? Using resistance bands.  Getting short amounts of exercise can be just as helpful as long structured periods of exercise. If you have trouble finding time to exercise, try to include exercise in your daily routine. ? Get up, stretch, and walk around every 30 minutes throughout the day. ? Go for  a walk during your lunch break. ? Park your car farther away from your destination. ? If you take public transportation, get off one stop early and walk the rest of the way. ? Make phone calls while standing up and walking around. ? Take the  stairs instead of elevators or escalators.  Wear comfortable clothes and shoes with good support.  Do not exercise so much that you hurt yourself, feel dizzy, or get very short of breath. Where to find more information  U.S[place] Department of Health and Human Services: [employer]at  Centers for Disease Control and Prevention (CDC): http://www.wolf.info/ Contact a health care provider:  Before starting a new exercise program.  If you have questions or concerns about your weight.  If you have a medical problem that keeps you from exercising. Get help right away if you have any of the following while exercising:  Injury.  Dizziness.  Difficulty breathing or shortness of breath that does not go away when you stop exercising.  Chest pain.  Rapid heartbeat. Summary  Being overweight increases your risk of heart disease, stroke, diabetes, high blood pressure, and several types of cancer.  Losing weight happens when you burn more calories than you eat.  Reducing the amount of calories you eat in addition to getting regular moderate or vigorous exercise each week helps you lose weight. This information is not intended to replace advice given to you by your health care provider. Make sure you discuss any questions you have with your health care provider. Document Released: 09/25/2010 Document Revised: 09/05/2017 Document Reviewed: 09/05/2017 Elsevier Interactive Patient Education  2019 [employer].

## 2018-12-26 LAB — CMP14+EGFR
ALT: 42 IU/L (ref 0–44)
AST: 34 IU/L (ref 0–40)
Albumin/Globulin Ratio: 1.6 (ref 1.2–2.2)
Albumin: 4.2 g/dL (ref 4.0–5.0)
Alkaline Phosphatase: 84 IU/L (ref 39–117)
BUN/Creatinine Ratio: 14 (ref 9–20)
BUN: 24 mg/dL (ref 6–24)
Bilirubin Total: 0.2 mg/dL (ref 0.0–1.2)
CO2: 21 mmol/L (ref 20–29)
Calcium: 9.6 mg/dL (ref 8.7–10.2)
Chloride: 101 mmol/L (ref 96–106)
Creatinine, Ser: 1.75 mg/dL — ABNORMAL HIGH (ref 0.76–1.27)
GFR calc Af Amer: 52 mL/min/{1.73_m2} — ABNORMAL LOW (ref >59)
GFR calc non Af Amer: 45 mL/min/{1.73_m2} — ABNORMAL LOW (ref >59)
Globulin, Total: 2.6 g/dL (ref 1.5–4.5)
Glucose: 102 mg/dL — ABNORMAL HIGH (ref 65–99)
Potassium: 4.5 mmol/L (ref 3.5–5.2)
Sodium: 141 mmol/L (ref 134–144)
Total Protein: 6.8 g/dL (ref 6.0–8.5)

## 2018-12-26 LAB — LIPID PANEL
Chol/HDL Ratio: 5.1 ratio — ABNORMAL HIGH (ref 0.0–5.0)
Cholesterol, Total: 187 mg/dL (ref 100–199)
HDL: 37 mg/dL — ABNORMAL LOW (ref >39)
LDL Calculated: 119 mg/dL — ABNORMAL HIGH (ref 0–99)
Triglycerides: 157 mg/dL — ABNORMAL HIGH (ref 0–149)
VLDL Cholesterol Cal: 31 mg/dL (ref 5–40)

## 2018-12-26 LAB — HEMOGLOBIN A1C
Est. average glucose Bld gHb Est-mCnc: 126 mg/dL
Hgb A1c MFr Bld: 6 % — ABNORMAL HIGH (ref 4.8–5.6)

## 2018-12-26 LAB — CK: Total CK: 717 U/L (ref 49–439)

## 2018-12-27 ENCOUNTER — Telehealth: Payer: Self-pay

## 2018-12-27 NOTE — Telephone Encounter (Signed)
Left the patient a message to call back for lab results. 

## 2018-12-27 NOTE — Telephone Encounter (Signed)
-----   Message from Glendale Chard, MD sent at 12/26/2018 11:41 AM EDT ----- Your liver and kidney fxn are stable. Your hba1c is 6.0. this is in predm range. Your chol has worsened. Your LDL, bad chol is 119. Ideally, this should be less than 100. Are you taking chol meds? Your muscle enzymes are greatly increased. pls stop the chol meds for now. Increase water intake as we discussed. pls contact me next week to let me know how you are feeling.

## 2019-01-02 MED FILL — OXYBUTYNIN CL ER 10 MG TAB: 10 | 90 days supply | Qty: 90 | Fill #3

## 2019-01-02 MED FILL — SILDENAFIL CITRATE 20 MG TA: 20 | 18 days supply | Qty: 90 | Fill #8

## 2019-01-22 ENCOUNTER — Encounter: Payer: Self-pay | Admitting: Internal Medicine

## 2019-01-22 ENCOUNTER — Other Ambulatory Visit: Payer: Self-pay

## 2019-01-22 ENCOUNTER — Ambulatory Visit: Payer: 59 | Admitting: Internal Medicine

## 2019-01-22 VITALS — BP 122/94 | HR 77 | Temp 97.7°F | Ht 70.5 in | Wt 237.0 lb

## 2019-01-22 DIAGNOSIS — N183 Chronic kidney disease, stage 3 unspecified: Secondary | ICD-10-CM

## 2019-01-22 DIAGNOSIS — I129 Hypertensive chronic kidney disease with stage 1 through stage 4 chronic kidney disease, or unspecified chronic kidney disease: Secondary | ICD-10-CM

## 2019-01-22 DIAGNOSIS — F17210 Nicotine dependence, cigarettes, uncomplicated: Secondary | ICD-10-CM | POA: Diagnosis not present

## 2019-01-22 DIAGNOSIS — R748 Abnormal levels of other serum enzymes: Secondary | ICD-10-CM

## 2019-01-22 MED FILL — OLMESARTAN MEDOXOMIL 20 MG: 20 | 30 days supply | Qty: 30 | Fill #1

## 2019-01-22 NOTE — Patient Instructions (Signed)
Muscle Cramps and Spasms Muscle cramps and spasms are when muscles tighten by themselves. They usually get better within minutes. Muscle cramps are painful. They are usually stronger and last longer than muscle spasms. Muscle spasms may or may not be painful. They can last a few seconds or much longer. Cramps and spasms can affect any muscle, but they occur most often in the calf muscles of the leg. They are usually not caused by a serious problem. In many cases, the cause is not known. Some common causes include:  Doing more physical work or exercise than your body is ready for.  Using the muscles too much (overuse) by repeating certain movements too many times.  Staying in a certain position for a long time.  Playing a sport or doing an activity without preparing properly.  Using bad form or technique while playing a sport or doing an activity.  Not having enough water in your body (dehydration).  Injury.  Side effects of some medicines.  Low levels of the salts and minerals in your blood (electrolytes), such as low potassium or calcium. Follow these instructions at home: Managing pain and stiffness      Massage, stretch, and relax the muscle. Do this for many minutes at a time.  If told, put heat on tight or tense muscles as often as told by your doctor. Use the heat source that your doctor recommends, such as a moist heat pack or a heating pad. ? Place a towel between your skin and the heat source. ? Leave the heat on for 20-30 minutes. ? Remove the heat if your skin turns bright red. This is very important if you are not able to feel pain, heat, or cold. You may have a greater risk of getting burned.  If told, put ice on the affected area. This may help if you are sore or have pain after a cramp or spasm. ? Put ice in a plastic bag. ? Place a towel between your skin and the bag. ? Leave the ice on for 20 minutes, 2-3 times a day.  Try taking hot showers or baths to help  relax tight muscles. Eating and drinking  Drink enough fluid to keep your pee (urine) pale yellow.  Eat a healthy diet to help ensure that your muscles work well. This should include: ? Fruits and vegetables. ? Lean protein. ? Whole grains. ? Low-fat or nonfat dairy products. General instructions  If you are having cramps often, avoid intense exercise for several days.  Take over-the-counter and prescription medicines only as told by your doctor.  Watch for any changes in your symptoms.  Keep all follow-up visits as told by your doctor. This is important. Contact a doctor if:  Your cramps or spasms get worse or happen more often.  Your cramps or spasms do not get better with time. Summary  Muscle cramps and spasms are when muscles tighten by themselves. They usually get better within minutes.  Cramps and spasms occur most often in the calf muscles of the leg.  Massage, stretch, and relax the muscle. This may help the cramp or spasm go away.  Drink enough fluid to keep your pee (urine) pale yellow. This information is not intended to replace advice given to you by your health care provider. Make sure you discuss any questions you have with your health care provider. Document Released: 08/05/2008 Document Revised: 01/16/2018 Document Reviewed: 01/16/2018 Elsevier Interactive Patient Education  2019 [employer] Inc.  

## 2019-01-23 LAB — BMP8+EGFR
BUN/Creatinine Ratio: 14 (ref 9–20)
BUN: 21 mg/dL (ref 6–24)
CO2: 20 mmol/L (ref 20–29)
Calcium: 9 mg/dL (ref 8.7–10.2)
Chloride: 107 mmol/L — ABNORMAL HIGH (ref 96–106)
Creatinine, Ser: 1.53 mg/dL — ABNORMAL HIGH (ref 0.76–1.27)
GFR calc Af Amer: 61 mL/min/{1.73_m2} (ref >59)
GFR calc non Af Amer: 53 mL/min/{1.73_m2} — ABNORMAL LOW (ref >59)
Glucose: 96 mg/dL (ref 65–99)
Potassium: 4.6 mmol/L (ref 3.5–5.2)
Sodium: 137 mmol/L (ref 134–144)

## 2019-01-23 LAB — CK: Total CK: 1160 U/L (ref 49–439)

## 2019-01-23 LAB — ANA: Anti Nuclear Antibody (ANA): NEGATIVE

## 2019-01-24 ENCOUNTER — Telehealth: Payer: Self-pay

## 2019-01-24 NOTE — Telephone Encounter (Signed)
Left the patient a message to call back for lab results. 

## 2019-01-24 NOTE — Telephone Encounter (Signed)
-----   Message from Glendale Chard, MD sent at 01/24/2019  7:30 AM EDT ----- Here are your lab results:  Your kidney function has improved some. This is great! Your muscle enzymes are elevated. Higher than your previous visit. Please continue to stay well hydrated. There is a thought that muscle enzymes can be more elevated in African-Americans.  However, since you are still having symptoms we can consider a Rheumatology evaluation. I will also add on an additional test - an aldolase to see if this gives [place] any additional information.   I will let you know when the results are back. Please let me know if you are okay with the referral. Stay safe!  Sincerely,    Robyn N. [facility]aird [facility], MD

## 2019-01-25 ENCOUNTER — Telehealth: Payer: Self-pay

## 2019-01-25 ENCOUNTER — Encounter: Payer: Self-pay | Admitting: Internal Medicine

## 2019-01-25 NOTE — Telephone Encounter (Signed)
Left the patient a message to call back for lab results. 

## 2019-01-25 NOTE — Telephone Encounter (Signed)
-----   Message from Glendale Chard, MD sent at 01/24/2019  7:30 AM EDT ----- Here are your lab results:  Your kidney function has improved some. This is great! Your muscle enzymes are elevated. Higher than your previous visit. Please continue to stay well hydrated. There is a thought that muscle enzymes can be more elevated in African-Americans.  However, since you are still having symptoms we can consider a Rheumatology evaluation. I will also add on an additional test - an aldolase to see if this gives [place] any additional information.   I will let you know when the results are back. Please let me know if you are okay with the referral. Stay safe!  Sincerely,    Robyn N. [facility]aird [facility], MD

## 2019-01-29 NOTE — Progress Notes (Signed)
  Subjective:     Patient ID: Dale Odom , male    DOB: 1971/08/10 , 48 y.o.   MRN: [id number]   Chief Complaint  Patient presents with  . Hypertension    HPI  He is here today for bp check. He was taken off of lisinopril/hctz and switched to olmesartan 22m daily.  He wanted to get off of the hctz b/c he felt that it was contributing to his severe muscle cramps. He admits that his symptoms have not improved much, even though he has switched to new bp medication. He has not had any issues with the new medication.     Past Medical History:  Diagnosis Date  . Hypertension      Family History  Problem Relation Age of Onset  . Hypertension Mother   . Hyperlipidemia Mother   . Heart Problems Father   . Prostate cancer Father      Current Outpatient Medications:  .  olmesartan (BENICAR) 20 MG tablet, Take 1 tablet (20 mg total) by mouth daily., Disp: 30 tablet, Rfl: 1 .  valACYclovir (VALTREX) 500 MG tablet, TAKE 1 TABLET BY MOUTH TWICE DAILY, Disp: 180 tablet, Rfl: 2   No Known Allergies   Review of Systems  Constitutional: Negative.   Respiratory: Negative.   Cardiovascular: Negative.   Gastrointestinal: Negative.   Neurological: Negative.   Psychiatric/Behavioral: Negative.      Today's Vitals   01/22/19 1146  BP: (!) 122/94  Pulse: 77  Temp: 97.7 F (36.5 C)  TempSrc: Oral  Weight: 237 lb (107.5 kg)  Height: 5' 10.5" (1.791 m)   Body mass index is 33.53 kg/m.   Objective:  Physical Exam Vitals signs and nursing note reviewed.  Constitutional:      Appearance: Normal appearance.  Cardiovascular:     Rate and Rhythm: Normal rate and regular rhythm.     Heart sounds: Normal heart sounds.  Pulmonary:     Effort: Pulmonary effort is normal.     Breath sounds: Normal breath sounds.  Skin:    General: Skin is warm.  Neurological:     General: No focal deficit present.     Mental Status: He is alert.  Psychiatric:        Mood and Affect: Mood normal.          Assessment And Plan:     1. Hypertensive nephropathy  Fair control. I would like to increase his dose of olemsartan 284m 2 tabs daily. He is also encouraged to incorporate more exercise into his daily routine.   - BMP8+EGFR  2. Chronic renal disease, stage III (HCC)  Chronic. He is encouraged to stay well hydrated.   3. Elevated CPK  I will check repeat CPK. Importance of adequate hydration was discussed with the patient. If persistent elevation, I will consider Rheum evaluation.   - CK, total - ANA  4. Cigarette nicotine dependence without complication  We discussed importance of tobacco cessation for 3 minutes. He does not wish to quit smoking at this time. Risks associated with tobacco use were discussed with the patient. We also discussed how smoking increases inflammation, which could be contributing to his muscular symptoms.   RoMaximino GreenlandMD    THE PATIENT IS ENCOURAGED TO PRACTICE SOCIAL DISTANCING DUE TO THE COVID-19 PANDEMIC.

## 2019-02-05 ENCOUNTER — Telehealth: Payer: Self-pay

## 2019-02-05 MED ORDER — OLMESARTAN MEDOXOMIL 40 MG PO TABS: 40.0000 mg | ORAL_TABLET | Freq: Every day | ORAL | 1 refills | Status: DC

## 2019-02-05 MED FILL — OLMESARTAN MEDOXOMIL 40 MG: 40 | 30 days supply | Qty: 30 | Fill #0

## 2019-02-05 NOTE — Telephone Encounter (Signed)
The pt was asked about how he is doing on the 2 tabs of olmesartan  20 mg and the pt said that he just started taking 2  And that he is doing ok.  The pt was told that a new prescription for the 40 mg of Olmesartan will be faxed to his pharmacy.

## 2019-02-14 MED FILL — SILDENAFIL CITRATE 20 MG TA: 20 | 18 days supply | Qty: 90 | Fill #9

## 2019-03-05 ENCOUNTER — Telehealth: Payer: Self-pay | Admitting: Internal Medicine

## 2019-03-05 NOTE — Telephone Encounter (Signed)
PT LVM REQ TO RESHEDULE 7/1 APPT APPT CANCELLED CALLED PT TO RESCHEDULE NO ANS LVM TO CALL [facility]

## 2019-03-06 ENCOUNTER — Ambulatory Visit: Payer: 59 | Admitting: Internal Medicine

## 2019-03-07 ENCOUNTER — Ambulatory Visit: Payer: 59 | Admitting: Internal Medicine

## 2019-03-07 ENCOUNTER — Other Ambulatory Visit: Payer: Self-pay

## 2019-03-07 ENCOUNTER — Encounter: Payer: Self-pay | Admitting: Internal Medicine

## 2019-03-07 VITALS — BP 140/92 | HR 73 | Temp 98.3°F | Ht 70.0 in | Wt 236.4 lb

## 2019-03-07 DIAGNOSIS — N183 Chronic kidney disease, stage 3 unspecified: Secondary | ICD-10-CM

## 2019-03-07 DIAGNOSIS — I129 Hypertensive chronic kidney disease with stage 1 through stage 4 chronic kidney disease, or unspecified chronic kidney disease: Secondary | ICD-10-CM | POA: Diagnosis not present

## 2019-03-07 DIAGNOSIS — Z79899 Other long term (current) drug therapy: Secondary | ICD-10-CM | POA: Diagnosis not present

## 2019-03-07 MED ORDER — AMLODIPINE BESYLATE 5 MG PO TABS: 5.0000 mg | ORAL_TABLET | Freq: Every day | ORAL | 1 refills | Status: DC

## 2019-03-07 MED ORDER — OLMESARTAN MEDOXOMIL 40 MG PO TABS: 40.0000 mg | ORAL_TABLET | Freq: Every day | ORAL | 1 refills | Status: DC

## 2019-03-07 MED FILL — SILDENAFIL CITRATE 20 MG TA: 20 | 18 days supply | Qty: 90 | Fill #10

## 2019-03-07 MED FILL — OLMESARTAN MEDOXOMIL 40 MG: 40 | 30 days supply | Qty: 30 | Fill #0

## 2019-03-07 MED FILL — AMLODIPINE BESYLATE 5 MG TA: 5 | 90 days supply | Qty: 90 | Fill #0

## 2019-03-08 LAB — BMP8+EGFR
BUN/Creatinine Ratio: 11 (ref 9–20)
BUN: 15 mg/dL (ref 6–24)
CO2: 21 mmol/L (ref 20–29)
Calcium: 9.4 mg/dL (ref 8.7–10.2)
Chloride: 102 mmol/L (ref 96–106)
Creatinine, Ser: 1.4 mg/dL — ABNORMAL HIGH (ref 0.76–1.27)
GFR calc Af Amer: 68 mL/min/{1.73_m2} (ref >59)
GFR calc non Af Amer: 59 mL/min/{1.73_m2} — ABNORMAL LOW (ref >59)
Glucose: 86 mg/dL (ref 65–99)
Potassium: 4.3 mmol/L (ref 3.5–5.2)
Sodium: 135 mmol/L (ref 134–144)

## 2019-03-11 NOTE — Progress Notes (Signed)
  Subjective:     Patient ID: Dale Odom , male    DOB: 1970-11-02 , 48 y.o.   MRN: [id number]   Chief Complaint  Patient presents with  . Hypertension    HPI  He is here today for a bp check. The dose of olmesartan was increased to '40mg'$  at his last visit. He has tolerated this without any issues. BP previously well controlled with use of hctz; however, due to CKD he wanted to stop the diuretic. He denies headaches, chest pain and shortness of breath.     Past Medical History:  Diagnosis Date  . Hypertension      Family History  Problem Relation Age of Onset  . Hypertension Mother   . Hyperlipidemia Mother   . Heart Problems Father   . Prostate cancer Father      Current Outpatient Medications:  .  olmesartan (BENICAR) 40 MG tablet, Take 1 tablet (40 mg total) by mouth daily., Disp: 90 tablet, Rfl: 1 .  valACYclovir (VALTREX) 500 MG tablet, TAKE 1 TABLET BY MOUTH TWICE DAILY, Disp: 180 tablet, Rfl: 2 .  amLODipine (NORVASC) 5 MG tablet, Take 1 tablet (5 mg total) by mouth daily., Disp: 90 tablet, Rfl: 1   No Known Allergies   Review of Systems  Constitutional: Negative.   Respiratory: Negative.   Cardiovascular: Negative.   Gastrointestinal: Negative.   Neurological: Negative.   Psychiatric/Behavioral: Negative.      Today's Vitals   03/07/19 1454  BP: (!) 140/92  Pulse: 73  Temp: 98.3 F (36.8 C)  TempSrc: Oral  SpO2: 97%  Weight: 236 lb 6.4 oz (107.2 kg)  Height: '5\' 10"'$  (1.778 m)   Body mass index is 33.92 kg/m.   Objective:  Physical Exam Vitals signs and nursing note reviewed.  Constitutional:      Appearance: Normal appearance.  Cardiovascular:     Rate and Rhythm: Normal rate and regular rhythm.     Heart sounds: Normal heart sounds.  Pulmonary:     Effort: Pulmonary effort is normal.     Breath sounds: Normal breath sounds.  Skin:    General: Skin is warm.  Neurological:     General: No focal deficit present.     Mental Status: He is  alert.  Psychiatric:        Mood and Affect: Mood normal.         Assessment And Plan:     1. Hypertensive nephropathy  Unfortunately, still not yet at goal. I will add amlodipine '5mg'$ , to be taken nightly, to his current regimen. He agrees to rto in 6 weeks for re-evaluation. Again, importance of regular exercise was discussed with the patient.   2. Chronic renal disease, stage III (HCC)  Chronic. Since dose of ARB was increased, I will check BMP today.   3. Drug therapy  - BMP8+EGFR        Maximino Greenland, MD    THE PATIENT IS ENCOURAGED TO PRACTICE SOCIAL DISTANCING DUE TO THE COVID-19 PANDEMIC.

## 2019-03-28 ENCOUNTER — Other Ambulatory Visit: Payer: Self-pay | Admitting: Internal Medicine

## 2019-03-28 DIAGNOSIS — R300 Dysuria: Secondary | ICD-10-CM | POA: Diagnosis not present

## 2019-03-28 DIAGNOSIS — R8271 Bacteriuria: Secondary | ICD-10-CM | POA: Diagnosis not present

## 2019-03-28 DIAGNOSIS — R3915 Urgency of urination: Secondary | ICD-10-CM | POA: Diagnosis not present

## 2019-03-28 MED FILL — OXYBUTYNIN CL ER 10 MG TAB: 10 | 90 days supply | Qty: 90 | Fill #4

## 2019-03-28 MED FILL — ROSUVASTATIN CALCIUM 40 MG: 40 | 90 days supply | Qty: 90 | Fill #0

## 2019-03-28 MED FILL — DOXYCYCLINE HYCLATE 100 MG: 100 | 10 days supply | Qty: 20 | Fill #0

## 2019-04-16 DIAGNOSIS — R3121 Asymptomatic microscopic hematuria: Secondary | ICD-10-CM | POA: Diagnosis not present

## 2019-04-16 DIAGNOSIS — R8271 Bacteriuria: Secondary | ICD-10-CM | POA: Diagnosis not present

## 2019-04-16 DIAGNOSIS — R300 Dysuria: Secondary | ICD-10-CM | POA: Diagnosis not present

## 2019-04-16 MED FILL — SILDENAFIL CITRATE 20 MG TA: 20 | 18 days supply | Qty: 90 | Fill #0

## 2019-04-17 MED FILL — OLMESARTAN MEDOXOMIL 40 MG: 40 | 30 days supply | Qty: 30 | Fill #1

## 2019-04-23 ENCOUNTER — Encounter: Payer: Self-pay | Admitting: Internal Medicine

## 2019-04-23 ENCOUNTER — Ambulatory Visit: Payer: 59 | Admitting: Internal Medicine

## 2019-04-23 ENCOUNTER — Other Ambulatory Visit: Payer: Self-pay

## 2019-04-23 VITALS — BP 142/80 | HR 79 | Temp 98.3°F | Ht 70.0 in | Wt 238.0 lb

## 2019-04-23 DIAGNOSIS — E6609 Other obesity due to excess calories: Secondary | ICD-10-CM

## 2019-04-23 DIAGNOSIS — Z6833 Body mass index (BMI) 33.0-33.9, adult: Secondary | ICD-10-CM

## 2019-04-23 DIAGNOSIS — N183 Chronic kidney disease, stage 3 unspecified: Secondary | ICD-10-CM

## 2019-04-23 DIAGNOSIS — I129 Hypertensive chronic kidney disease with stage 1 through stage 4 chronic kidney disease, or unspecified chronic kidney disease: Secondary | ICD-10-CM

## 2019-04-23 NOTE — Patient Instructions (Signed)
Exercising to Stay Healthy To become healthy and stay healthy, it is recommended that you do moderate-intensity and vigorous-intensity exercise. You can tell that you are exercising at a moderate intensity if your heart starts beating faster and you start breathing faster but can still hold a conversation. You can tell that you are exercising at a vigorous intensity if you are breathing much harder and faster and cannot hold a conversation while exercising. Exercising regularly is important. It has many health benefits, such as:  Improving overall fitness, flexibility, and endurance.  Increasing bone density.  Helping with weight control.  Decreasing body fat.  Increasing muscle strength.  Reducing stress and tension.  Improving overall health. How often should I exercise? Choose an activity that you enjoy, and set realistic goals. Your health care provider can help you make an activity plan that works for you. Exercise regularly as told by your health care provider. This may include:  Doing strength training two times a week, such as: ? Lifting weights. ? Using resistance bands. ? Push-ups. ? Sit-ups. ? Yoga.  Doing a certain intensity of exercise for a given amount of time. Choose from these options: ? A total of 150 minutes of moderate-intensity exercise every week. ? A total of 75 minutes of vigorous-intensity exercise every week. ? A mix of moderate-intensity and vigorous-intensity exercise every week. Children, pregnant women, people who have not exercised regularly, people who are overweight, and older adults may need to talk with a health care provider about what activities are safe to do. If you have a medical condition, be sure to talk with your health care provider before you start a new exercise program. What are some exercise ideas? Moderate-intensity exercise ideas include:  Walking 1 mile (1.6 km) in about 15 minutes.  Biking.  Hiking.  Golfing.  Dancing.   Water aerobics. Vigorous-intensity exercise ideas include:  Walking 4.5 miles (7.2 km) or more in about 1 hour.  Jogging or running 5 miles (8 km) in about 1 hour.  Biking 10 miles (16.1 km) or more in about 1 hour.  Lap swimming.  Roller-skating or in-line skating.  Cross-country skiing.  Vigorous competitive sports, such as football, basketball, and soccer.  Jumping rope.  Aerobic dancing. What are some everyday activities that can help me to get exercise?  Yard work, such as: ? Pushing a lawn mower. ? Raking and bagging leaves.  Washing your car.  Pushing a stroller.  Shoveling snow.  Gardening.  Washing windows or floors. How can I be more active in my day-to-day activities?  Use stairs instead of an elevator.  Take a walk during your lunch break.  If you drive, park your car farther away from your work or school.  If you take public transportation, get off one stop early and walk the rest of the way.  Stand up or walk around during all of your indoor phone calls.  Get up, stretch, and walk around every 30 minutes throughout the day.  Enjoy exercise with a friend. Support to continue exercising will help you keep a regular routine of activity. What guidelines can I follow while exercising?  Before you start a new exercise program, talk with your health care provider.  Do not exercise so much that you hurt yourself, feel dizzy, or get very short of breath.  Wear comfortable clothes and wear shoes with good support.  Drink plenty of water while you exercise to prevent dehydration or heat stroke.  Work out until your breathing   and your heartbeat get faster. Where to find more information  [place] Department of Health and Human Services: www.hhs.gov  Centers for Disease Control and Prevention (CDC): www.cdc.gov Summary  Exercising regularly is important. It will improve your overall fitness, flexibility, and endurance.  Regular exercise also will  improve your overall health. It can help you control your weight, reduce stress, and improve your bone density.  Do not exercise so much that you hurt yourself, feel dizzy, or get very short of breath.  Before you start a new exercise program, talk with your health care provider. This information is not intended to replace advice given to you by your health care provider. Make sure you discuss any questions you have with your health care provider. Document Released: 09/25/2010 Document Revised: 08/05/2017 Document Reviewed: 07/14/2017 Elsevier Patient Education  2020 [employer]  

## 2019-04-23 NOTE — Progress Notes (Signed)
Subjective:     Patient ID: Dale Odom , male    DOB: [birth year] , 48 y.o.   MRN: [id number]   Chief Complaint  Patient presents with  . Hypertension    HPI  He is here today for a bp check. Amlodipine 5mg  daily was started at his last visit. He has not had any side effects from the medication. He reports exercising 2-3 times per week.   Hypertension This is a chronic problem. The current episode started more than 1 year ago. The problem has been gradually improving since onset. The problem is uncontrolled. Pertinent negatives include no blurred vision or chest pain. Past treatments include angiotensin blockers and calcium channel blockers. The current treatment provides moderate improvement. Compliance problems include exercise.  Hypertensive end-organ damage includes kidney disease.     Past Medical History:  Diagnosis Date  . Hypertension      Family History  Problem Relation Age of Onset  . Hypertension Mother   . Hyperlipidemia Mother   . Heart Problems Father   . Prostate cancer Father      Current Outpatient Medications:  .  amLODipine (NORVASC) 5 MG tablet, Take 1 tablet (5 mg total) by mouth daily., Disp: 90 tablet, Rfl: 1 .  olmesartan (BENICAR) 40 MG tablet, Take 1 tablet (40 mg total) by mouth daily., Disp: 90 tablet, Rfl: 1 .  rosuvastatin (CRESTOR) 40 MG tablet, TAKE 1 TABLET BY MOUTH ONCE DAILY, Disp: 90 tablet, Rfl: 2 .  valACYclovir (VALTREX) 500 MG tablet, TAKE 1 TABLET BY MOUTH TWICE DAILY, Disp: 180 tablet, Rfl: 2   No Known Allergies   Review of Systems  Constitutional: Negative.   Eyes: Negative for blurred vision.  Respiratory: Negative.   Cardiovascular: Negative.  Negative for chest pain.  Gastrointestinal: Negative.   Neurological: Negative.   Psychiatric/Behavioral: Negative.      Today's Vitals   04/23/19 1431  BP: (!) 142/80  Pulse: 79  Temp: 98.3 F (36.8 C)  TempSrc: Oral  Weight: 238 lb (108 kg)  Height: 5\' 10"  (1.778 m)    Body mass index is 34.15 kg/m.   Objective:  Physical Exam Vitals signs and nursing note reviewed.  Constitutional:      Appearance: Normal appearance.  Cardiovascular:     Rate and Rhythm: Normal rate and regular rhythm.     Heart sounds: Normal heart sounds.  Pulmonary:     Effort: Pulmonary effort is normal.     Breath sounds: Normal breath sounds.  Skin:    General: Skin is warm.  Neurological:     General: No focal deficit present.     Mental Status: He is alert.  Psychiatric:        Mood and Affect: Mood normal.         Assessment And Plan:     1. Hypertensive nephropathy  Chronic, uncontrolled. He agrees to increase amlodipine to 10mg  daily. He will take TWO (2) 5mg  tablets nightly, starting tonite.  He will rto in 3 months for re-evaluation. He promises to continue to check his bp at home and at work. He will touch base with me in two weeks to give me an update on his bp readings.   2. Chronic renal disease, stage III (HCC)  Chronic, yet stable.   3. Class 1 obesity due to excess calories with serious comorbidity and body mass index (BMI) of 33.0 to 33.9 in adult  He is encouraged to strive for BMI less than 30  to decrease cardiac risk.   Maximino Greenland, MD    THE PATIENT IS ENCOURAGED TO PRACTICE SOCIAL DISTANCING DUE TO THE COVID-19 PANDEMIC.

## 2019-04-27 DIAGNOSIS — R3121 Asymptomatic microscopic hematuria: Secondary | ICD-10-CM | POA: Diagnosis not present

## 2019-04-27 DIAGNOSIS — R3129 Other microscopic hematuria: Secondary | ICD-10-CM | POA: Diagnosis not present

## 2019-05-03 DIAGNOSIS — R3915 Urgency of urination: Secondary | ICD-10-CM | POA: Diagnosis not present

## 2019-05-03 DIAGNOSIS — N401 Enlarged prostate with lower urinary tract symptoms: Secondary | ICD-10-CM | POA: Diagnosis not present

## 2019-05-03 DIAGNOSIS — R3121 Asymptomatic microscopic hematuria: Secondary | ICD-10-CM | POA: Diagnosis not present

## 2019-05-08 ENCOUNTER — Other Ambulatory Visit (HOSPITAL_COMMUNITY): Payer: Self-pay | Admitting: Urology

## 2019-05-08 ENCOUNTER — Ambulatory Visit (HOSPITAL_COMMUNITY)
Admission: RE | Admit: 2019-05-08 | Discharge: 2019-05-08 | Disposition: A | Discharge status: Home / Self Care | Payer: 59 | Source: Ambulatory Visit | Attending: Urology | Admitting: Urology

## 2019-05-08 DIAGNOSIS — D49511 Neoplasm of unspecified behavior of right kidney: Secondary | ICD-10-CM | POA: Diagnosis not present

## 2019-05-08 DIAGNOSIS — C641 Malignant neoplasm of right kidney, except renal pelvis: Secondary | ICD-10-CM | POA: Diagnosis not present

## 2019-05-17 ENCOUNTER — Other Ambulatory Visit: Payer: Self-pay | Admitting: Urology

## 2019-05-17 MED FILL — OLMESARTAN MEDOXOMIL 40 MG: 40 | 30 days supply | Qty: 30 | Fill #1

## 2019-05-17 MED FILL — SILDENAFIL CITRATE 20 MG TA: 20 | 18 days supply | Qty: 90 | Fill #1

## 2019-05-25 NOTE — Progress Notes (Signed)
PCP - ROBYN SANDERS Cardiologist -   Chest x-ray - 05-09-19 epic EKG - 06-26-18 epic Stress Test -  ECHO -  Cardiac Cath -   Sleep Study -  CPAP -   Fasting Blood Sugar -  Checks Blood Sugar _____ times a day  Blood Thinner Instructions: Aspirin Instructions: Last Dose:  Anesthesia review: creatine 1.65  Patient denies shortness of breath, fever, cough and chest pain at PAT appointment   Patient verbalized understanding of instructions that were given to them at the PAT appointment. Patient was also instructed that they will need to review over the PAT instructions again at home before surgery.

## 2019-05-25 NOTE — Patient Instructions (Addendum)
DUE TO COVID-19 ONLY ONE VISITOR IS ALLOWED TO COME WITH YOU AND STAY IN THE WAITING ROOM ONLY DURING PRE OP AND PROCEDURE DAY OF SURGERY. THE 1 VISITOR MAY VISIT WITH YOU AFTER SURGERY IN YOUR PRIVATE ROOM DURING VISITING HOURS ONLY!  YOU NEED TO HAVE A COVID 19 TEST ON_______ @_______ , THIS TEST MUST BE DONE BEFORE SURGERY, COME  [place], [place] , [id number].  ([place]) ONCE YOUR COVID TEST IS COMPLETED, PLEASE BEGIN THE QUARANTINE INSTRUCTIONS AS OUTLINED IN YOUR HANDOUT.                MARUS WOOLLEY  05/25/2019   Your procedure is scheduled on: 06-07-19   Report to [facility] Main  Entrance   Report to admitting at       [place]  AM     Call this number if you have problems the morning of surgery [phone]    Remember: Do not eat food or drink liquids :After Midnight.  BRUSH YOUR TEETH MORNING OF SURGERY AND RINSE YOUR MOUTH OUT, NO CHEWING GUM CANDY OR MINTS.     Take these medicines the morning of surgery with A SIP OF WATER: valtrex, amlodipine, oxybutin            ONE BOTTLE OF MAGNESIUM CITRATE BY NOON THE DAY BEFORE SURGERY                                You may not have any metal on your body including hair pins and              piercings  Do not wear jewelry, , lotions, powders or perfumes, deodorant                     Men may shave face and neck.   Do not bring valuables to the hospital. [place].  Contacts, dentures or bridgework may not be worn into surgery.                Please read over the following fact sheets you were given: _____________________________________________________________________             [facility] - Preparing for Surgery Before surgery, you can play an important role.  Because skin is not sterile, your skin needs to be as free of germs as possible.  You can reduce the number of germs on your skin by washing with CHG (chlorahexidine  gluconate) soap before surgery.  CHG is an antiseptic cleaner which kills germs and bonds with the skin to continue killing germs even after washing. Please DO NOT use if you have an allergy to CHG or antibacterial soaps.  If your skin becomes reddened/irritated stop using the CHG and inform your nurse when you arrive at Short Stay. Do not shave (including legs and underarms) for at least 48 hours prior to the first CHG shower.  You may shave your face/neck. Please follow these instructions carefully:  1.  Shower with CHG Soap the night before surgery and the  morning of Surgery.  2.  If you choose to wash your hair, wash your hair first as usual with your  normal  shampoo.  3.  After you shampoo, rinse your hair and body thoroughly to remove the  shampoo.  4.  Use CHG as you would any other liquid soap.  You can apply chg directly  to the skin and wash                       Gently with a scrungie or clean washcloth.  5.  Apply the CHG Soap to your body ONLY FROM THE NECK DOWN.   Do not use on face/ open                           Wound or open sores. Avoid contact with eyes, ears mouth and genitals (private parts).                       Wash face,  Genitals (private parts) with your normal soap.             6.  Wash thoroughly, paying special attention to the area where your surgery  will be performed.  7.  Thoroughly rinse your body with warm water from the neck down.  8.  DO NOT shower/wash with your normal soap after using and rinsing off  the CHG Soap.                9.  Pat yourself dry with a clean towel.            10.  Wear clean pajamas.            11.  Place clean sheets on your bed the night of your first shower and do not  sleep with pets. Day of Surgery : Do not apply any lotions/deodorants the morning of surgery.  Please wear clean clothes to the hospital/surgery center.  FAILURE TO FOLLOW THESE INSTRUCTIONS MAY RESULT IN THE CANCELLATION OF YOUR  SURGERY PATIENT SIGNATURE_________________________________  NURSE SIGNATURE__________________________________  ________________________________________________________________________

## 2019-05-28 ENCOUNTER — Other Ambulatory Visit: Payer: Self-pay

## 2019-05-28 ENCOUNTER — Encounter (HOSPITAL_COMMUNITY)
Admission: RE | Admit: 2019-05-28 | Discharge: 2019-05-28 | Disposition: A | Discharge status: Home / Self Care | Payer: 59 | Source: Ambulatory Visit | Attending: Urology | Admitting: Urology

## 2019-05-28 ENCOUNTER — Encounter (HOSPITAL_COMMUNITY): Payer: Self-pay

## 2019-05-28 DIAGNOSIS — Z01812 Encounter for preprocedural laboratory examination: Secondary | ICD-10-CM | POA: Diagnosis not present

## 2019-05-28 HISTORY — DX: Prediabetes: R73.03

## 2019-05-28 HISTORY — DX: Chronic kidney disease, unspecified: N18.9

## 2019-05-28 HISTORY — DX: Malignant (primary) neoplasm, unspecified: C80.1

## 2019-05-28 LAB — HEMOGLOBIN A1C
Hgb A1c MFr Bld: 5.8 % — ABNORMAL HIGH (ref 4.8–5.6)
Mean Plasma Glucose: 119.76 mg/dL

## 2019-05-28 LAB — BASIC METABOLIC PANEL
Anion gap: 7 (ref 5–15)
BUN: 13 mg/dL (ref 6–20)
CO2: 24 mmol/L (ref 22–32)
Calcium: 9.2 mg/dL (ref 8.9–10.3)
Chloride: 106 mmol/L (ref 98–111)
Creatinine, Ser: 1.65 mg/dL — ABNORMAL HIGH (ref 0.61–1.24)
GFR calc Af Amer: 56 mL/min — ABNORMAL LOW (ref >60)
GFR calc non Af Amer: 48 mL/min — ABNORMAL LOW (ref >60)
Glucose, Bld: 101 mg/dL — ABNORMAL HIGH (ref 70–99)
Potassium: 4.6 mmol/L (ref 3.5–5.1)
Sodium: 137 mmol/L (ref 135–145)

## 2019-05-28 LAB — CBC
HCT: 43.9 % (ref 39.0–52.0)
Hemoglobin: 14.3 g/dL (ref 13.0–17.0)
MCH: 28.4 pg (ref 26.0–34.0)
MCHC: 32.6 g/dL (ref 30.0–36.0)
MCV: 87.1 fL (ref 80.0–100.0)
Platelets: 225 10*3/uL (ref 150–400)
RBC: 5.04 MIL/uL (ref 4.22–5.81)
RDW: 14.4 % (ref 11.5–15.5)
WBC: 5.6 10*3/uL (ref 4.0–10.5)
nRBC: 0 % (ref 0.0–0.2)

## 2019-05-28 MED FILL — AMLODIPINE BESYLATE 5 MG TA: 5 | 90 days supply | Qty: 90 | Fill #1

## 2019-06-04 ENCOUNTER — Other Ambulatory Visit (HOSPITAL_COMMUNITY)
Admission: RE | Admit: 2019-06-04 | Discharge: 2019-06-04 | Disposition: A | Discharge status: Home / Self Care | Payer: 59 | Source: Ambulatory Visit | Attending: Urology | Admitting: Urology

## 2019-06-04 DIAGNOSIS — Z20828 Contact with and (suspected) exposure to other viral communicable diseases: Secondary | ICD-10-CM | POA: Insufficient documentation

## 2019-06-04 DIAGNOSIS — Z01812 Encounter for preprocedural laboratory examination: Secondary | ICD-10-CM | POA: Diagnosis not present

## 2019-06-05 ENCOUNTER — Encounter: Payer: Self-pay | Admitting: Internal Medicine

## 2019-06-05 ENCOUNTER — Other Ambulatory Visit: Payer: Self-pay

## 2019-06-05 LAB — NOVEL CORONAVIRUS, NAA (HOSP ORDER, SEND-OUT TO REF LAB; TAT 18-24 HRS): SARS-CoV-2, NAA: NOT DETECTED

## 2019-06-05 MED ORDER — AMLODIPINE BESYLATE 10 MG PO TABS: 10.0000 mg | ORAL_TABLET | Freq: Every day | ORAL | 0 refills | Status: DC

## 2019-06-06 NOTE — H&P (Signed)
Office Visit Report     05/08/2019   --------------------------------------------------------------------------------   Dale Odom  MRN: [id number]  DOB: [birth year], 48 year old Male  SSN: [id number]   PRIMARY CARE:  Glendale Chard, MD  REFERRING:    PROVIDER:  Kathie Rhodes, M.D.  TREATING:  Raynelle Bring, M.D.  LOCATION:  [facility] [phone]     --------------------------------------------------------------------------------   CC/HPI: CC: Right renal neoplasm  Physician requesting consult: Dr. Kathie Rhodes  Primary care physician: Dr. Glendale Chard   Dale Odom is a 48 year old male who had been followed by Dr. Karsten Ro for LUTS treated with oxybutynin, history of HSV-2 treated with Valtrex prn, and erectile dysfunction treated with Cialis 5 mg prn. Due to incidentally detected, asymptomatic microscopic hematuria, he underwent a recent evaluation and was incidentally found to have a 1.6 cm right renal neoplasm concerning for possible malignancy.   Family history of kidney cancer: None.  Family history of ESRD: None.   Imaging: CT scan (04/27/19)  Side of renal neoplasm: Right  Size of renal neoplasm: 1.6 cm  Location of renal neoplasm: Lateral interpolar  Exophytic or endophytic: Endophytic  Renal nephrometry score: 11x   Renal artery anatomy: Single renal artery  Renal vein anatomy: Single renal vein   Contralateral renal lesions: Left renal simply cyst  Regional lymphadenopathy: None.  Adrenal masses: None.  Renal vein/IVC involvement: No.  Metastatic disease to the abdomen: No.   Chest imaging: PENDING  LFTs: PENDING   Baseline renal function: Cr 1.1 , eGFR >60 ml/min. He previously had been diagnosed with chronic kidney disease stage 3 and had been followed by Dr. Justin Mend. His renal function has improved and he continues to follow-up with Dr. Justin Mend.   PMH: Past medical history is significant for hypertension.  PSH: No abdominal surgeries.      ALLERGIES: No Allergies    MEDICATIONS: Ditropan Xl 10 mg tablet, extended release 24 hr 1 tablet PO Daily  Sildenafil Citrate 20 mg tablet 1 tablet PO PRN 1-5 tabs as needed for Intimacy  Amlodipine Besylate 10 mg tablet  Losartan Potassium  Tadalafil 10 Mg Tablet take 1/2-1 tab by mouth as needed  Valacyclovir 1,000 mg tablet 0 Oral     GU PSH: Cystoscopy - 05/03/2019 Locm 300-399Mg/Ml Iodine,1Ml - 04/27/2019 Vasectomy - 2012       PSH Notes: Surgery Of Male Genitalia Vasectomy, Rotator Cuff Repair, Inguinal Hernia Repair   NON-GU PSH: Rotator Cuff Surgery..     GU PMH: BPH w/LUTS (Stable), He does have BPH noted cystoscopically today there were no lesions within the bladder. - 05/03/2019, He has BPH by exam but does not have obstructive symptoms primarily only the urgency and frequency. His prostate was noted to be benign on examination but it has been 3 years since his last PSA so I therefore will obtain 1 today., - 05/17/2018 Microscopic hematuria (Stable), He had no abnormalities of the upper tract or on cystoscopy today to account for his microscopic hematuria and his urine was clear today. I have reassured him that his microscopic hematuria was not secondary to the peripherally located lesion noted incidentally on his CT scan. - 05/03/2019, I found microscopic hematuria today and with new onset urgency this could be significant so when he returns 2 weeks I will reassess his urine and if his microscopic hematuria persists a full hematuria workup will be undertaken., - 05/17/2018 Right renal neoplasm, Right, After discussing all the options I have scheduled  him for an appointment with Dr. Alinda Money to discuss laparoscopic partial nephrectomy. His only prior surgery was an inguinal herniorrhaphy. - 05/03/2019 Urinary Urgency (Stable), He does have some mild urgency. This remains stable. - 05/03/2019, - 03/28/2019 (Improving), His urgency responded to Myrbetriq at a 25 mg dose. He is pleased  with the results but said there still some room for improvement so I am going to increase the dosage to 50 mg and sent his prescription in., - 05/30/2018, I am going to give him samples of Myrbetriq 25 mg and then reassess his voiding symptoms when he returns in 2 weeks., - 05/17/2018 Dysuria - 03/28/2019 Post-void dribbling, I can find nothing on exam that would suggest a reason for his post void dribbling. This is a benign condition that I have reassured him would cause him no difficulty. - 2018 ED due to arterial insufficiency, Erectile dysfunction due to arterial insufficiency - 2014      PMH Notes: Herpes: Since 2004 he has had type II genital herpes. He was tested and tested positive for this. He came to see me because when he has an outbreak it results in scarring and the scarring results in deep pigmentation.Marland Kitchen He wanted to know if there is any medication that could be used to help with this.  Treatment: Valtrex 1 g p.r.n.   Erectile dysfunction: He mentioned that he has been having difficulty achieving is firm erection as he had in the past. He is on antihypertensive medication which certainly could be contributing to this as could his hypertension. He wanted to know if there was any medication that could be used for this and we therefore discussed phosphodiesterase inhibitor therapy. He would like to proceed with that.  Treatment: One half a 10 mg Cialis   NON-GU PMH: Pyuria/other UA findings, The symptoms he is describing and the white cells noted in his urine suggest possible urethritis. I'm going to place him on a course of doxycycline. - 2018 Herpesviral infection of urogenital system, unspecified, Herpes, genital - 2014 Personal history of other endocrine, nutritional and metabolic disease, History of hypercholesterolemia - 2014 Personal history of transient ischemic attack (TIA), and cerebral infarction without residual deficits, History of transient cerebral ischemia - 2014    FAMILY  HISTORY: Diabetes - Father Family Health Status Number - Runs In Family Hypertension - Mother Prostate Cancer - Father   SOCIAL HISTORY: Marital Status: Married Preferred Language: English; Ethnicity: Not Hispanic Or Latino; Race: Black or African American Current Smoking Status: Patient has never smoked.   Tobacco Use Assessment Completed: Used Tobacco in last 30 days? Does not drink anymore.  Does not drink caffeine.     Notes: Former smoker, Marital History - Currently Married, Caffeine Use   REVIEW OF SYSTEMS:    GU Review Male:   Patient denies frequent urination, hard to postpone urination, burning/ pain with urination, get up at night to urinate, leakage of urine, stream starts and stops, trouble starting your streams, and have to strain to urinate .  Gastrointestinal (Upper):   Patient denies nausea and vomiting.  Gastrointestinal (Lower):   Patient denies diarrhea and constipation.  Constitutional:   Patient denies fever, night sweats, weight loss, and fatigue.  Skin:   Patient denies itching and skin rash/ lesion.  Eyes:   Patient denies blurred vision and double vision.  Ears/ Nose/ Throat:   Patient denies sore throat and sinus problems.  Hematologic/Lymphatic:   Patient denies swollen glands and easy bruising.  Cardiovascular:  Patient denies leg swelling and chest pains.  Respiratory:   Patient denies cough and shortness of breath.  Endocrine:   Patient denies excessive thirst.  Musculoskeletal:   Patient denies back pain and joint pain.  Neurological:   Patient denies headaches and dizziness.  Psychologic:   Patient denies depression and anxiety.   VITAL SIGNS:      05/08/2019 03:09 PM  Weight 240 lb / 108.86 kg  Height 73.5 in / 186.69 cm  BP 121/81 mmHg  Pulse 71 /min  BMI 31.2 kg/m   MULTI-SYSTEM PHYSICAL EXAMINATION:    Constitutional: Well-nourished. No physical deformities. Normally developed. Good grooming.  Neck: Neck symmetrical, not swollen. Normal  tracheal position.  Respiratory: No labored breathing, no use of accessory muscles. Clear bilaterally.  Cardiovascular: Normal temperature, normal extremity pulses, no swelling, no varicosities. Regular rate and rhythm.  Lymphatic: No enlargement of neck, axillae, groin.  Skin: No paleness, no jaundice, no cyanosis. No lesion, no ulcer, no rash.  Neurologic / Psychiatric: Oriented to time, oriented to place, oriented to person. No depression, no anxiety, no agitation.  Gastrointestinal: No mass, no tenderness, no rigidity, non obese abdomen.  Eyes: Normal conjunctivae. Normal eyelids.  Ears, Nose, Mouth, and Throat: Left ear no scars, no lesions, no masses. Right ear no scars, no lesions, no masses. Nose no scars, no lesions, no masses. Normal hearing. Normal lips.  Musculoskeletal: Normal gait and station of head and neck.     PAST DATA REVIEWED:  Source Of History:  Patient  Urine Test Review:   Urinalysis  X-Ray Review: C.T. Abdomen/Pelvis: Reviewed Films.     05/17/18 02/13/15  PSA  Total PSA 0.92 ng/mL 0.90 ng/dl   Notes:                     CLINICAL DATA: Microscopic hematuria. Frequency and nocturia. Lower  abdominal pain.   EXAM:  CT ABDOMEN AND PELVIS WITHOUT AND WITH CONTRAST   TECHNIQUE:  Multidetector CT imaging of the abdomen and pelvis was performed  following the standard protocol before and following the bolus  administration of intravenous contrast.   CONTRAST: 125 cc of Omnipaque 300   COMPARISON: 09/02/2007 abdomen CT. No prior pelvic imaging.   FINDINGS:  Lower chest: Lingular scarring or subsegmental atelectasis. Trace  bilateral pleural thickening. Normal heart size.   Hepatobiliary: Normal liver. Normal gallbladder, without biliary  ductal dilatation.   Pancreas: Normal, without mass or ductal dilatation.   Spleen: Normal in size, without focal abnormality.   Adrenals/Urinary Tract: Normal adrenal glands. No renal calculi or  hydronephrosis.  Pelvic phleboliths, but no ureteric calculi or  hydroureter. No bladder calculi.   3.5 cm anterior upper/interpolar left renal cyst.   A well-circumscribed lateral interpolar right renal lesion measures  1.6 cm. Demonstrates postcontrast enhancement, including on image  38/5 and 37/9.   Moderate renal collecting system opacification on delayed images.  Good ureteric opacification, without filling defect.   On portal venous phase imaging, the bladder appears thick walled,  but is underdistended. No enhancing bladder mass or bladder filling  defect on delayed images.   Stomach/Bowel: Normal stomach, without wall thickening. Normal  colon, appendix, and terminal ileum. Normal small bowel.   Vascular/Lymphatic: Normal caliber of the aorta and branch vessels.  Patent renal veins. No abdominopelvic adenopathy.   Reproductive: Normal prostate.   Other: No significant free fluid.   Musculoskeletal: Bilateral L5 and L3 pars defects. Anterolisthesis  of L3 on L4 is mild.  Advanced degenerate disc disease at this level.   IMPRESSION:  1. Lateral interpolar right renal lesion demonstrates postcontrast  enhancement, highly suspicious for renal cell carcinoma. No evidence  of metastatic disease or right renal vein involvement.  2. The bladder wall appears thickened on portal venous phase images,  but is underdistended. Cannot exclude cystitis, given the history of  urinary urgency.   These results will be called to the ordering clinician or  representative by the Radiologist Assistant, and communication  documented in the PACS or zVision Dashboard.    Electronically Signed  By: Abigail Miyamoto M.D.  On: 04/27/2019 09:45   PROCEDURES:          Urinalysis w/Scope Dipstick Dipstick Cont'd Micro  Color: Amber Bilirubin: Neg mg/dL WBC/hpf: NS (Not Seen)  Appearance: Clear Ketones: Neg mg/dL RBC/hpf: NS (Not Seen)  Specific Gravity: 1.030 Blood: Neg ery/uL Bacteria: Rare (0-9/hpf)  pH:  6.0 Protein: 3+ mg/dL Cystals: Amorph Phosphates  Glucose: Neg mg/dL Urobilinogen: 0.2 mg/dL Casts: Hyaline    Nitrites: Neg Trichomonas: Not Present    Leukocyte Esterase: Neg leu/uL Mucous: Present      Epithelial Cells: 0 - 5/hpf      Yeast: NS (Not Seen)      Sperm: Not Present    ASSESSMENT:      ICD-10 Details  1 GU:   Right renal neoplasm - D49.511 Right   PLAN:           Orders Labs CMP  X-Rays: Chest X-Ray Outside.          Schedule Return Visit/Planned Activity: Other See Visit Notes             Note: Will call to schedule surgery          Document Letter(s):  Created for Patient: Clinical Summary         Notes:   1. Right renal neoplasm: We reviewed his imaging that raises concern for possible early stage renal cell carcinoma. The patient was provided information regarding their renal mass including the relative risk of benign versus malignant pathology and the natural history of renal cell carcinoma and other possible malignancies of the kidney. The role of renal biopsy, laboratory testing, and imaging studies to further characterize renal masses and/or the presence of metastatic disease were explained. We discussed the role of active surveillance, surgical therapy with both radical nephrectomy and nephron-sparing surgery, and ablative therapy in the treatment of renal masses. In addition, we discussed our goals of providing an accurate diagnosis and oncologic control while maintaining optimal renal function as appropriate based on the size, location, and complexity of their renal mass as well as their co-morbidities.  We have discussed the risks of treatment in detail including but not limited to bleeding, infection, heart attack, stroke, death, venothromoboembolism, cancer recurrence, injury/damage to surrounding organs and structures, urine leak, the possibility of open surgical conversion for patients undergoing minimally invasive surgery, the risk of developing chronic  kidney disease and its associated implications, and the potential risk of end stage renal disease possibly necessitating dialysis.   After reviewing options for management, I did recommend that he strongly consider nephron sparing surgery. He is in agreement and will be scheduled for a right robot assisted laparoscopic partial nephrectomy. 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 I will ask at the request of him and his wife who is a Designer, jewellery that we have the ability to have the Cell Saver at least on backup  call if need be. We discussed the option of having him donate blood but would likely have to delay his therapy to allow his hemoglobin to increase prior to surgery. He does not wish to delay surgery. He understands the overall risk of requiring blood transfusion with this surgery is relatively low. He will complete his staging evaluation with chest imaging and laboratory studies today. He will be scheduled for a right robot assisted laparoscopic partial nephrectomy.   Cc: Dr. Glendale Chard  Dr. Edrick Oh    E & M CODE: I spent at least 52 minutes face to face with the patient, more than 50% of that time was spent on counseling and/or coordinating care.     * Signed by Raynelle Bring, M.D. on 05/08/19 at 5:51 PM (EDT)*

## 2019-06-07 ENCOUNTER — Ambulatory Visit (HOSPITAL_COMMUNITY): Payer: 59 | Admitting: Certified Registered"

## 2019-06-07 ENCOUNTER — Encounter (HOSPITAL_COMMUNITY): Payer: Self-pay

## 2019-06-07 ENCOUNTER — Encounter (HOSPITAL_COMMUNITY)
Admission: RE | Disposition: A | Discharge status: Home / Self Care | Payer: Self-pay | Source: Home / Self Care | Attending: Urology

## 2019-06-07 ENCOUNTER — Other Ambulatory Visit: Payer: Self-pay

## 2019-06-07 ENCOUNTER — Ambulatory Visit (HOSPITAL_COMMUNITY): Payer: 59 | Admitting: Physician Assistant

## 2019-06-07 ENCOUNTER — Observation Stay (HOSPITAL_COMMUNITY)
Admission: RE | Admit: 2019-06-07 | Discharge: 2019-06-08 | Disposition: A | Discharge status: Home / Self Care | Payer: 59 | Attending: Urology | Admitting: Urology

## 2019-06-07 DIAGNOSIS — N183 Chronic kidney disease, stage 3 unspecified: Secondary | ICD-10-CM | POA: Diagnosis not present

## 2019-06-07 DIAGNOSIS — I10 Essential (primary) hypertension: Secondary | ICD-10-CM | POA: Diagnosis not present

## 2019-06-07 DIAGNOSIS — Z8042 Family history of malignant neoplasm of prostate: Secondary | ICD-10-CM | POA: Insufficient documentation

## 2019-06-07 DIAGNOSIS — I129 Hypertensive chronic kidney disease with stage 1 through stage 4 chronic kidney disease, or unspecified chronic kidney disease: Secondary | ICD-10-CM | POA: Diagnosis not present

## 2019-06-07 DIAGNOSIS — C641 Malignant neoplasm of right kidney, except renal pelvis: Secondary | ICD-10-CM | POA: Diagnosis not present

## 2019-06-07 DIAGNOSIS — D49511 Neoplasm of unspecified behavior of right kidney: Secondary | ICD-10-CM | POA: Diagnosis not present

## 2019-06-07 DIAGNOSIS — D4101 Neoplasm of uncertain behavior of right kidney: Secondary | ICD-10-CM | POA: Diagnosis not present

## 2019-06-07 DIAGNOSIS — N529 Male erectile dysfunction, unspecified: Secondary | ICD-10-CM | POA: Diagnosis not present

## 2019-06-07 DIAGNOSIS — Z79899 Other long term (current) drug therapy: Secondary | ICD-10-CM | POA: Insufficient documentation

## 2019-06-07 HISTORY — PX: ROBOTIC ASSITED PARTIAL NEPHRECTOMY: SHX6087

## 2019-06-07 LAB — HEMOGLOBIN AND HEMATOCRIT, BLOOD
HCT: 43.3 % (ref 39.0–52.0)
Hemoglobin: 13.8 g/dL (ref 13.0–17.0)

## 2019-06-07 SURGERY — NEPHRECTOMY, PARTIAL, ROBOT-ASSISTED
Anesthesia: General | Laterality: Right

## 2019-06-07 MED ORDER — BUPIVACAINE LIPOSOME 1.3 % IJ SUSP
20.0000 mL | Freq: Once | INTRAMUSCULAR | Status: AC
  Administered 2019-06-07: 20 mL
  Filled 2019-06-07: qty 20

## 2019-06-07 MED ORDER — AMLODIPINE BESYLATE 10 MG PO TABS
10.0000 mg | ORAL_TABLET | Freq: Every day | ORAL | Status: DC
  Administered 2019-06-07: 10 mg via ORAL
  Filled 2019-06-07: qty 1

## 2019-06-07 MED ORDER — DOCUSATE SODIUM 100 MG PO CAPS
100.0000 mg | ORAL_CAPSULE | Freq: Two times a day (BID) | ORAL | Status: DC
  Administered 2019-06-07 – 2019-06-08 (×2): 100 mg via ORAL
  Filled 2019-06-07 (×2): qty 1

## 2019-06-07 MED ORDER — MIDAZOLAM HCL 2 MG/2ML IJ SOLN
INTRAMUSCULAR | Status: AC
  Filled 2019-06-07: qty 2

## 2019-06-07 MED ORDER — CEFAZOLIN SODIUM-DEXTROSE 1-4 GM/50ML-% IV SOLN
1.0000 g | Freq: Three times a day (TID) | INTRAVENOUS | Status: AC
  Administered 2019-06-07 – 2019-06-08 (×2): 1 g via INTRAVENOUS
  Filled 2019-06-07 (×2): qty 50

## 2019-06-07 MED ORDER — SUGAMMADEX SODIUM 500 MG/5ML IV SOLN
INTRAVENOUS | Status: AC
  Filled 2019-06-07: qty 5

## 2019-06-07 MED ORDER — FENTANYL CITRATE (PF) 250 MCG/5ML IJ SOLN
INTRAMUSCULAR | Status: DC | PRN
  Administered 2019-06-07: 50 ug via INTRAVENOUS
  Administered 2019-06-07: 25 ug via INTRAVENOUS
  Administered 2019-06-07: 100 ug via INTRAVENOUS
  Administered 2019-06-07: 25 ug via INTRAVENOUS
  Administered 2019-06-07: 50 ug via INTRAVENOUS

## 2019-06-07 MED ORDER — TRAMADOL HCL 50 MG PO TABS: 50.0000 mg | ORAL_TABLET | Freq: Four times a day (QID) | ORAL | 0 refills | Status: DC | PRN

## 2019-06-07 MED ORDER — DIPHENHYDRAMINE HCL 12.5 MG/5ML PO ELIX: 12.5000 mg | ORAL_SOLUTION | Freq: Four times a day (QID) | ORAL | Status: DC | PRN

## 2019-06-07 MED ORDER — FENTANYL CITRATE (PF) 250 MCG/5ML IJ SOLN
INTRAMUSCULAR | Status: AC
  Filled 2019-06-07: qty 5

## 2019-06-07 MED ORDER — ROCURONIUM BROMIDE 10 MG/ML (PF) SYRINGE
PREFILLED_SYRINGE | INTRAVENOUS | Status: DC | PRN
  Administered 2019-06-07: 60 mg via INTRAVENOUS
  Administered 2019-06-07: 20 mg via INTRAVENOUS
  Administered 2019-06-07 (×2): 10 mg via INTRAVENOUS
  Administered 2019-06-07 (×2): 20 mg via INTRAVENOUS

## 2019-06-07 MED ORDER — MAGNESIUM CITRATE PO SOLN
1.0000 | Freq: Once | ORAL | Status: DC
  Filled 2019-06-07: qty 296

## 2019-06-07 MED ORDER — DEXTROSE-NACL 5-0.45 % IV SOLN
INTRAVENOUS | Status: DC
  Administered 2019-06-07 – 2019-06-08 (×2): via INTRAVENOUS

## 2019-06-07 MED ORDER — ONDANSETRON HCL 4 MG/2ML IJ SOLN
4.0000 mg | INTRAMUSCULAR | Status: DC | PRN
  Administered 2019-06-08: 4 mg via INTRAVENOUS
  Filled 2019-06-07: qty 2

## 2019-06-07 MED ORDER — FENTANYL CITRATE (PF) 100 MCG/2ML IJ SOLN
INTRAMUSCULAR | Status: AC
  Filled 2019-06-07: qty 2

## 2019-06-07 MED ORDER — DIPHENHYDRAMINE HCL 50 MG/ML IJ SOLN: 12.5000 mg | Freq: Four times a day (QID) | INTRAMUSCULAR | Status: DC | PRN

## 2019-06-07 MED ORDER — SUGAMMADEX SODIUM 200 MG/2ML IV SOLN
INTRAVENOUS | Status: DC | PRN
  Administered 2019-06-07: 250 mg via INTRAVENOUS

## 2019-06-07 MED ORDER — FENTANYL CITRATE (PF) 100 MCG/2ML IJ SOLN
25.0000 ug | INTRAMUSCULAR | Status: DC | PRN
  Administered 2019-06-07: 50 ug via INTRAVENOUS

## 2019-06-07 MED ORDER — ORAL CARE MOUTH RINSE
15.0000 mL | Freq: Two times a day (BID) | OROMUCOSAL | Status: DC
  Administered 2019-06-07 – 2019-06-08 (×2): 15 mL via OROMUCOSAL

## 2019-06-07 MED ORDER — LIDOCAINE 2% (20 MG/ML) 5 ML SYRINGE
INTRAMUSCULAR | Status: AC
  Filled 2019-06-07: qty 5

## 2019-06-07 MED ORDER — LIDOCAINE 2% (20 MG/ML) 5 ML SYRINGE
INTRAMUSCULAR | Status: DC | PRN
  Administered 2019-06-07: 40 mg via INTRAVENOUS

## 2019-06-07 MED ORDER — PROPOFOL 10 MG/ML IV BOLUS
INTRAVENOUS | Status: DC | PRN
  Administered 2019-06-07: 200 mg via INTRAVENOUS

## 2019-06-07 MED ORDER — EPHEDRINE 5 MG/ML INJ
INTRAVENOUS | Status: AC
  Filled 2019-06-07: qty 10

## 2019-06-07 MED ORDER — OXYCODONE HCL 5 MG/5ML PO SOLN: 5.0000 mg | Freq: Once | ORAL | Status: DC | PRN

## 2019-06-07 MED ORDER — MORPHINE SULFATE (PF) 2 MG/ML IV SOLN
2.0000 mg | INTRAVENOUS | Status: DC | PRN
  Administered 2019-06-07 (×2): 2 mg via INTRAVENOUS
  Filled 2019-06-07 (×2): qty 1

## 2019-06-07 MED ORDER — ONDANSETRON HCL 4 MG/2ML IJ SOLN: 4.0000 mg | Freq: Once | INTRAMUSCULAR | Status: DC | PRN

## 2019-06-07 MED ORDER — LACTATED RINGERS IV SOLN
INTRAVENOUS | Status: DC
  Administered 2019-06-07: 10:00:00 via INTRAVENOUS

## 2019-06-07 MED ORDER — SODIUM CHLORIDE (PF) 0.9 % IJ SOLN
INTRAMUSCULAR | Status: DC | PRN
  Administered 2019-06-07: 20 mL

## 2019-06-07 MED ORDER — PROPOFOL 10 MG/ML IV BOLUS
INTRAVENOUS | Status: AC
  Filled 2019-06-07: qty 20

## 2019-06-07 MED ORDER — LACTATED RINGERS IR SOLN
Status: DC | PRN
  Administered 2019-06-07: 1000 mL

## 2019-06-07 MED ORDER — DEXAMETHASONE SODIUM PHOSPHATE 10 MG/ML IJ SOLN
INTRAMUSCULAR | Status: DC | PRN
  Administered 2019-06-07: 8 mg via INTRAVENOUS

## 2019-06-07 MED ORDER — DEXAMETHASONE SODIUM PHOSPHATE 10 MG/ML IJ SOLN
INTRAMUSCULAR | Status: AC
  Filled 2019-06-07: qty 1

## 2019-06-07 MED ORDER — STERILE WATER FOR IRRIGATION IR SOLN
Status: DC | PRN
  Administered 2019-06-07: 1000 mL

## 2019-06-07 MED ORDER — ROCURONIUM BROMIDE 10 MG/ML (PF) SYRINGE
PREFILLED_SYRINGE | INTRAVENOUS | Status: AC
  Filled 2019-06-07: qty 10

## 2019-06-07 MED ORDER — ACETAMINOPHEN 10 MG/ML IV SOLN
1000.0000 mg | Freq: Four times a day (QID) | INTRAVENOUS | Status: AC
  Administered 2019-06-07 – 2019-06-08 (×4): 1000 mg via INTRAVENOUS
  Filled 2019-06-07 (×6): qty 100

## 2019-06-07 MED ORDER — EPHEDRINE SULFATE-NACL 50-0.9 MG/10ML-% IV SOSY
PREFILLED_SYRINGE | INTRAVENOUS | Status: DC | PRN
  Administered 2019-06-07: 5 mg via INTRAVENOUS
  Administered 2019-06-07: 15 mg via INTRAVENOUS
  Administered 2019-06-07: 10 mg via INTRAVENOUS

## 2019-06-07 MED ORDER — OXYCODONE HCL 5 MG PO TABS: 5.0000 mg | ORAL_TABLET | Freq: Once | ORAL | Status: DC | PRN

## 2019-06-07 MED ORDER — ONDANSETRON HCL 4 MG/2ML IJ SOLN
INTRAMUSCULAR | Status: AC
  Filled 2019-06-07: qty 2

## 2019-06-07 MED ORDER — ONDANSETRON HCL 4 MG/2ML IJ SOLN
INTRAMUSCULAR | Status: DC | PRN
  Administered 2019-06-07: 4 mg via INTRAVENOUS

## 2019-06-07 MED ORDER — CEFAZOLIN SODIUM-DEXTROSE 2-4 GM/100ML-% IV SOLN
2.0000 g | INTRAVENOUS | Status: AC
  Administered 2019-06-07: 2 g via INTRAVENOUS
  Filled 2019-06-07: qty 100

## 2019-06-07 MED ORDER — VALACYCLOVIR HCL 500 MG PO TABS
500.0000 mg | ORAL_TABLET | Freq: Every day | ORAL | Status: DC
  Administered 2019-06-07 – 2019-06-08 (×2): 500 mg via ORAL
  Filled 2019-06-07 (×2): qty 1

## 2019-06-07 MED ORDER — INFLUENZA VAC SPLIT QUAD 0.5 ML IM SUSY
0.5000 mL | PREFILLED_SYRINGE | INTRAMUSCULAR | Status: DC
  Filled 2019-06-07: qty 0.5

## 2019-06-07 MED ORDER — MIDAZOLAM HCL 2 MG/2ML IJ SOLN
INTRAMUSCULAR | Status: DC | PRN
  Administered 2019-06-07: 2 mg via INTRAVENOUS

## 2019-06-07 MED FILL — traMADol HCL 50 MG TABS: 50 | 5 days supply | Qty: 30 | Fill #0

## 2019-06-07 SURGICAL SUPPLY — 56 items
ADH SKN CLS APL DERMABOND .7 (GAUZE/BANDAGES/DRESSINGS) ×1
AGENT HMST KT MTR STRL THRMB (HEMOSTASIS)
APL ESCP 34 STRL LF DISP (HEMOSTASIS)
APL PRP STRL LF DISP 70% ISPRP (MISCELLANEOUS) ×1
APPLICATOR SURGIFLO ENDO (HEMOSTASIS) IMPLANT
BAG SPEC RTRVL LRG 6X4 10 (ENDOMECHANICALS) ×1
CHLORAPREP W/TINT 26 (MISCELLANEOUS) ×2 IMPLANT
CLIP VESOLOCK LG 6/CT PURPLE (CLIP) ×2 IMPLANT
CLIP VESOLOCK MED LG 6/CT (CLIP) ×4 IMPLANT
COVER SURGICAL LIGHT HANDLE (MISCELLANEOUS) ×2 IMPLANT
COVER TIP SHEARS 8 DVNC (MISCELLANEOUS) ×1 IMPLANT
COVER TIP SHEARS 8MM DA VINCI (MISCELLANEOUS) ×1
COVER WAND RF STERILE (DRAPES) IMPLANT
DECANTER SPIKE VIAL GLASS SM (MISCELLANEOUS) ×2 IMPLANT
DERMABOND ADVANCED (GAUZE/BANDAGES/DRESSINGS) ×1
DERMABOND ADVANCED .7 DNX12 (GAUZE/BANDAGES/DRESSINGS) ×2 IMPLANT
DRAIN CHANNEL 15F RND FF 3/16 (WOUND CARE) ×2 IMPLANT
DRAPE ARM DVNC X/XI (DISPOSABLE) ×4 IMPLANT
DRAPE COLUMN DVNC XI (DISPOSABLE) ×1 IMPLANT
DRAPE DA VINCI XI ARM (DISPOSABLE) ×4
DRAPE DA VINCI XI COLUMN (DISPOSABLE) ×1
DRAPE INCISE IOBAN 66X45 STRL (DRAPES) ×2 IMPLANT
DRAPE SHEET LG 3/4 BI-LAMINATE (DRAPES) ×2 IMPLANT
ELECT PENCIL ROCKER SW 15FT (MISCELLANEOUS) ×2 IMPLANT
ELECT REM PT RETURN 15FT ADLT (MISCELLANEOUS) ×2 IMPLANT
EVACUATOR SILICONE 100CC (DRAIN) ×2 IMPLANT
GLOVE BIO SURGEON STRL SZ 6.5 (GLOVE) ×2 IMPLANT
GLOVE BIOGEL M STRL SZ7.5 (GLOVE) ×4 IMPLANT
GOWN STRL REUS W/TWL LRG LVL3 (GOWN DISPOSABLE) ×6 IMPLANT
HEMOSTAT SURGICEL 4X8 (HEMOSTASIS) ×1 IMPLANT
IRRIG SUCT STRYKERFLOW 2 WTIP (MISCELLANEOUS) ×2
IRRIGATION SUCT STRKRFLW 2 WTP (MISCELLANEOUS) ×1 IMPLANT
KIT BASIN OR (CUSTOM PROCEDURE TRAY) ×2 IMPLANT
KIT TURNOVER KIT A (KITS) IMPLANT
POUCH SPECIMEN RETRIEVAL 10MM (ENDOMECHANICALS) ×2 IMPLANT
PROTECTOR NERVE ULNAR (MISCELLANEOUS) ×4 IMPLANT
SEAL CANN UNIV 5-8 DVNC XI (MISCELLANEOUS) ×4 IMPLANT
SEAL XI 5MM-8MM UNIVERSAL (MISCELLANEOUS) ×4
SET TUBE SMOKE EVAC HIGH FLOW (TUBING) ×1 IMPLANT
SOLUTION ELECTROLUBE (MISCELLANEOUS) ×2 IMPLANT
SURGIFLO W/THROMBIN 8M KIT (HEMOSTASIS) ×1 IMPLANT
SUT ETHILON 3 0 PS 1 (SUTURE) ×2 IMPLANT
SUT MNCRL AB 4-0 PS2 18 (SUTURE) ×4 IMPLANT
SUT V-LOC BARB 180 2/0GR6 GS22 (SUTURE) ×2
SUT VIC AB 0 CT1 27 (SUTURE) ×2
SUT VIC AB 0 CT1 27XBRD ANTBC (SUTURE) ×1 IMPLANT
SUT VICRYL 0 UR6 27IN ABS (SUTURE) ×1 IMPLANT
SUT VLOC BARB 180 ABS3/0GR12 (SUTURE) ×2
SUTURE V-LC BRB 180 2/0GR6GS22 (SUTURE) ×1 IMPLANT
SUTURE VLOC BRB 180 ABS3/0GR12 (SUTURE) ×1 IMPLANT
TOWEL OR 17X26 10 PK STRL BLUE (TOWEL DISPOSABLE) ×2 IMPLANT
TRAY FOLEY MTR SLVR 16FR STAT (SET/KITS/TRAYS/PACK) ×2 IMPLANT
TRAY LAPAROSCOPIC (CUSTOM PROCEDURE TRAY) ×2 IMPLANT
TROCAR BLADELESS OPT 5 100 (ENDOMECHANICALS) IMPLANT
TROCAR XCEL 12X100 BLDLESS (ENDOMECHANICALS) ×2 IMPLANT
WATER STERILE IRR 1000ML POUR (IV SOLUTION) ×2 IMPLANT

## 2019-06-07 NOTE — Anesthesia Postprocedure Evaluation (Signed)
Anesthesia Post Note  Patient: Dale Odom  Procedure(s) Performed: XI ROBOTIC ASSITED PARTIAL NEPHRECTOMY (Right )     Patient location during evaluation: PACU Anesthesia Type: General Level of consciousness: awake and alert Pain management: pain level controlled Vital Signs Assessment: post-procedure vital signs reviewed and stable Respiratory status: spontaneous breathing, nonlabored ventilation, respiratory function stable and patient connected to nasal cannula oxygen Cardiovascular status: blood pressure returned to baseline and stable Postop Assessment: no apparent nausea or vomiting Anesthetic complications: no    Last Vitals:  Vitals:   06/07/19 1545 06/07/19 1600  BP: (!) 145/93 137/87  Pulse: 92 72  Resp: 12 16  Temp:  36.8 C  SpO2: 98% 98%    Last Pain:  Vitals:   06/07/19 1600  TempSrc:   PainSc: 3                  Ernestina Joe COKER

## 2019-06-07 NOTE — Anesthesia Procedure Notes (Addendum)
Procedure Name: Intubation Date/Time: 06/07/2019 12:05 PM Performed by: Eben Burow, CRNA Pre-anesthesia Checklist: Patient identified, Emergency Drugs available, Suction available, Patient being monitored and Timeout performed Patient Re-evaluated:Patient Re-evaluated prior to induction Oxygen Delivery Method: Circle system utilized Preoxygenation: Pre-oxygenation with 100% oxygen Induction Type: IV induction Ventilation: Mask ventilation without difficulty Laryngoscope Size: Mac and 4 Grade View: Grade I Tube type: Oral Tube size: 8.0 mm Number of attempts: 1 Airway Equipment and Method: Stylet Placement Confirmation: ETT inserted through vocal cords under direct vision,  positive ETCO2 and breath sounds checked- equal and bilateral Secured at: 23 cm Tube secured with: Tape Dental Injury: Teeth and Oropharynx as per pre-operative assessment

## 2019-06-07 NOTE — Progress Notes (Signed)
Patient ID: Dale Odom, male   DOB: 03/26/1971, 48 y.o.   MRN: [id number]  Post-op note  Subjective: The patient is doing well.  No complaints.  Objective: Vital signs in last 24 hours: Temp:  [97.6 F (36.4 C)-98.4 F (36.9 C)] 98.4 F (36.9 C) (10/01 1625) Pulse Rate:  [70-92] 73 (10/01 1625) Resp:  [12-23] 20 (10/01 1625) BP: (137-163)/(87-103) 159/103 (10/01 1625) SpO2:  [97 %-100 %] 97 % (10/01 1625) Weight:  [108.9 kg] 108.9 kg (10/01 0947)  Intake/Output from previous day: No intake/output data recorded. Intake/Output this shift: Total I/O In: 2055 [P.O.:200; I.V.:1655; IV Piggyback:200] Out: 630 [Urine:475; Drains:35; Blood:120]  Physical Exam:  General: Alert and oriented. Abdomen: Soft, Nondistended. Incisions: Clean and dry.  Lab Results: Recent Labs    06/07/19 1439  HGB 13.8  HCT 43.3    Assessment/Plan: POD#0   1) Continue to monitor   Pryor Curia. MD   LOS: 0 days   Dutch Gray 06/07/2019, 6:05 PM

## 2019-06-07 NOTE — Discharge Instructions (Signed)

## 2019-06-07 NOTE — Op Note (Signed)
Preoperative diagnosis: Right renal neoplasm  Postoperative diagnosis: Right renal neoplasm  Procedure:  1. Right robotic-assisted laparoscopic partial nephrectomy 2. Intraoperative renal ultrasonography   Surgeon: Pryor Curia. M.D.  Assistant(s): Debbrah Alar, PA-C  An assistant was required for this surgical procedure.  The duties of the assistant included but were not limited to suctioning, passing suture, camera manipulation, retraction. This procedure would not be able to be performed without an Environmental consultant.  Anesthesia: General  Complications: None  EBL: 120 mL  IVF:  1500 mL crystalloid  Specimens: 1. Right renal neoplasm  Disposition of specimens: Pathology  Intraoperative findings:       1. Warm renal ischemia time: 15 minutes       2. Intraoperative renal ultrasound findings: A 1.7 x 1.7 cm solid mass was identified in the lateral interpolar kidney.  Drains: 1. # 15 Blake perinephric drain  Indication:  Dale Odom is a 48 y.o. year old patient with a right renal mass.  After a thorough review of the management options for their renal mass, they elected to proceed with surgical treatment and the above procedure.  We have discussed the potential benefits and risks of the procedure, side effects of the proposed treatment, the likelihood of the patient achieving the goals of the procedure, and any potential problems that might occur during the procedure or recuperation. Informed consent has been obtained.   Description of procedure:  The patient was taken to the operating room and a general anesthetic was administered. The patient was given preoperative antibiotics, placed in the right modified flank position with care to pad all potential pressure points, and prepped and draped in the usual sterile fashion. Next a preoperative timeout was performed.  A site was selected on in the midline for placement of the assistant port. This was placed using a  standard open Hassan technique which allowed entry into the peritoneal cavity under direct vision and without difficulty. A 12 mm port was placed and a pneumoperitoneum established. The camera was then used to inspect the abdomen and there was no evidence of any intra-abdominal injuries or other abnormalities. The remaining abdominal ports were then placed. 8 mm robotic ports were placed in the right upper quadrant, right lower quadrant, and far right lateral abdominal wall. An 8 mm port was placed for the camera site just right of the umbilicus. All ports were placed under direct vision without difficulty. The surgical cart was then docked.   Utilizing the cautery scissors, the white line of Toldt was incised allowing the colon to be mobilized medially and the plane between the mesocolon and the anterior layer of Gerota's fascia to be developed and the kidney to be exposed.  The ureter and gonadal vein were identified inferiorly and the ureter was lifted anteriorly off the psoas muscle.  Dissection proceeded superiorly along the gonadal vein until the renal vein was identified.  The renal hilum was then carefully isolated with a combination of blunt and sharp dissection allowing the renal arterial and venous structures to be separated and isolated in preparation for renal hilar vessel clamping. There appeared to be a single renal artery and vein.    Attention turned to the kidney and the perinephric fat surrounding the renal mass was removed and the kidney was mobilized sufficiently for exposure and resection of the renal mass.   Intraoperative renal ultrasonography was utilized with the laparoscopic ultrasound probe to identify the renal tumor and identify the tumor margins.    Once  the renal mass was properly isolated, preparations were made for resection of the tumor.  Reconstructive sutures were placed into the abdomen for the renorrhaphy portion of the procedure.  The renal artery was then clamped with  bulldog clamps.  The tumor was then excised with cold scissor dissection along with an adequate visible gross margin of normal renal parenchyma. The tumor appeared to be excised without any gross violation of the tumor. The renal collecting system was not entered during removal of the tumor.  A running 3-0 V-lock suture was then brought through the capsule of the kidney and run along the base of the renal defect to provide hemostasis and close any entry into the renal collecting system if present. Weck clips were used to secure this suture outside the renal capsule at the proximal and distal ends. A running 2-0 V lock suture was then used to close the capsule of the kidney over a surgicel bolster using a sliding clip technique which resulted in excellent hemostasis.    The bulldog clamps were then removed from the renal hilar vessel(s).. Total warm renal ischemia time was 15 minutes. The renal tumor resection site was examined. Hemostasis appeared adequate.   The kidney was placed back into its normal anatomic position and covered with perinephric fat as needed.  A # 14 Blake drain was then brought through the lateral lower port site and positioned in the perinephric space.  It was secured to the skin with a nylon suture. The surgical cart was undocked.  The renal tumor specimen was removed intact within an endopouch retrieval bag via the upper midline port site. This incision site was closed at the fascial layer with 0-vicryl suture. All other laparoscopic/robotic ports were removed under direct vision and the pneumoperitoneum let down with inspection of the operative field performed and hemostasis again confirmed. All incision sites were then injected with local anesthetic and reapproximated at the skin level with 4-0 monocryl subcuticular closures.  Dermabond was applied to the skin.  The patient tolerated the procedure well and without complications.  The patient was able to be extubated and transferred to  the recovery unit in satisfactory condition.  Pryor Curia MD

## 2019-06-07 NOTE — Anesthesia Preprocedure Evaluation (Signed)
Anesthesia Evaluation  Patient identified by MRN, date of birth, ID band Patient awake    Reviewed: Allergy & Precautions, NPO status , Patient's Chart, lab work & pertinent test results  Airway Mallampati: II  TM Distance: >3 FB Neck ROM: Full    Dental  (+) Teeth Intact, Dental Advisory Given   Pulmonary Current Smoker,    breath sounds clear to auscultation       Cardiovascular hypertension,  Rhythm:Regular Rate:Normal     Neuro/Psych    GI/Hepatic   Endo/Other    Renal/GU      Musculoskeletal   Abdominal   Peds  Hematology   Anesthesia Other Findings   Reproductive/Obstetrics                             Anesthesia Physical Anesthesia Plan  ASA: III  Anesthesia Plan: General   Post-op Pain Management:    Induction: Intravenous  PONV Risk Score and Plan: Ondansetron and Dexamethasone  Airway Management Planned: Oral ETT  Additional Equipment:   Intra-op Plan:   Post-operative Plan: Extubation in OR  Informed Consent: I have reviewed the patients History and Physical, chart, labs and discussed the procedure including the risks, benefits and alternatives for the proposed anesthesia with the patient or authorized representative who has indicated his/her understanding and acceptance.     Dental advisory given  Plan Discussed with: CRNA and Anesthesiologist  Anesthesia Plan Comments:         Anesthesia Quick Evaluation  

## 2019-06-07 NOTE — Interval H&P Note (Signed)
History and Physical Interval Note:  06/07/2019 10:56 AM  Dale Odom  has presented today for surgery, with the diagnosis of RIGHT RENAL NEOPLASM.  The various methods of treatment have been discussed with the patient and family. After consideration of risks, benefits and other options for treatment, the patient has consented to  Procedure(s): XI ROBOTIC ASSITED PARTIAL NEPHRECTOMY (Right) as a surgical intervention.  The patient's history has been reviewed, patient examined, no change in status, stable for surgery.  I have reviewed the patient's chart and labs.  Questions were answered to the patient's satisfaction.     [employer]

## 2019-06-07 NOTE — Transfer of Care (Signed)
Immediate Anesthesia Transfer of Care Note  Patient: Dale Odom  Procedure(s) Performed: XI ROBOTIC ASSITED PARTIAL NEPHRECTOMY (Right )  Patient Location: PACU  Anesthesia Type:General  Level of Consciousness: awake, alert  and oriented  Airway & Oxygen Therapy: Patient Spontanous Breathing and Patient connected to face mask oxygen  Post-op Assessment: Report given to RN and Post -op Vital signs reviewed and stable  Post vital signs: Reviewed and stable  Last Vitals:  Vitals Value Taken Time  BP    Temp    Pulse    Resp    SpO2      Last Pain:  Vitals:   06/07/19 0947  TempSrc:   PainSc: 0-No pain         Complications: No apparent anesthesia complications

## 2019-06-08 ENCOUNTER — Encounter (HOSPITAL_COMMUNITY): Payer: Self-pay | Admitting: Urology

## 2019-06-08 DIAGNOSIS — N529 Male erectile dysfunction, unspecified: Secondary | ICD-10-CM | POA: Diagnosis not present

## 2019-06-08 DIAGNOSIS — Z79899 Other long term (current) drug therapy: Secondary | ICD-10-CM | POA: Diagnosis not present

## 2019-06-08 DIAGNOSIS — C641 Malignant neoplasm of right kidney, except renal pelvis: Secondary | ICD-10-CM | POA: Diagnosis not present

## 2019-06-08 DIAGNOSIS — I10 Essential (primary) hypertension: Secondary | ICD-10-CM | POA: Diagnosis not present

## 2019-06-08 DIAGNOSIS — Z8042 Family history of malignant neoplasm of prostate: Secondary | ICD-10-CM | POA: Diagnosis not present

## 2019-06-08 LAB — BASIC METABOLIC PANEL
Anion gap: 10 (ref 5–15)
BUN: 13 mg/dL (ref 6–20)
CO2: 21 mmol/L — ABNORMAL LOW (ref 22–32)
Calcium: 8.8 mg/dL — ABNORMAL LOW (ref 8.9–10.3)
Chloride: 104 mmol/L (ref 98–111)
Creatinine, Ser: 1.67 mg/dL — ABNORMAL HIGH (ref 0.61–1.24)
GFR calc Af Amer: 55 mL/min — ABNORMAL LOW (ref >60)
GFR calc non Af Amer: 47 mL/min — ABNORMAL LOW (ref >60)
Glucose, Bld: 150 mg/dL — ABNORMAL HIGH (ref 70–99)
Potassium: 4.2 mmol/L (ref 3.5–5.1)
Sodium: 135 mmol/L (ref 135–145)

## 2019-06-08 LAB — CREATININE, FLUID (PLEURAL, PERITONEAL, JP DRAINAGE): Creat, Fluid: 1.8 mg/dL

## 2019-06-08 LAB — HEMOGLOBIN AND HEMATOCRIT, BLOOD
HCT: 42.3 % (ref 39.0–52.0)
Hemoglobin: 13.8 g/dL (ref 13.0–17.0)

## 2019-06-08 LAB — SURGICAL PATHOLOGY

## 2019-06-08 MED ORDER — BISACODYL 10 MG RE SUPP
10.0000 mg | Freq: Once | RECTAL | Status: AC
  Administered 2019-06-08: 10 mg via RECTAL
  Filled 2019-06-08: qty 1

## 2019-06-08 MED ORDER — OXYCODONE HCL 5 MG PO TABS
5.0000 mg | ORAL_TABLET | Freq: Four times a day (QID) | ORAL | Status: DC | PRN
  Administered 2019-06-08: 04:00:00 5 mg via ORAL
  Filled 2019-06-08: qty 1

## 2019-06-08 MED ORDER — TRAMADOL HCL 50 MG PO TABS
50.0000 mg | ORAL_TABLET | Freq: Four times a day (QID) | ORAL | Status: DC | PRN
  Administered 2019-06-08: 100 mg via ORAL
  Filled 2019-06-08: qty 2

## 2019-06-08 MED ORDER — CHLORHEXIDINE GLUCONATE CLOTH 2 % EX PADS
6.0000 | MEDICATED_PAD | Freq: Every day | CUTANEOUS | Status: DC
  Administered 2019-06-08: 6 via TOPICAL

## 2019-06-08 MED ORDER — HYDRALAZINE HCL 10 MG PO TABS
10.0000 mg | ORAL_TABLET | Freq: Once | ORAL | Status: AC
  Administered 2019-06-08: 10 mg via ORAL
  Filled 2019-06-08: qty 1

## 2019-06-08 MED ORDER — IRBESARTAN 300 MG PO TABS
300.0000 mg | ORAL_TABLET | Freq: Every day | ORAL | Status: DC
  Administered 2019-06-08: 11:00:00 300 mg via ORAL
  Filled 2019-06-08: qty 1

## 2019-06-08 NOTE — Progress Notes (Signed)
Pt to be discharged to home this evening. Pt and Pt's Wife given discharge instructions including Medications and schedules reviewed with Pt and pt's Wife. Pt and Pt's Wife verbalized understanding of all discharge teaching. Discharge packet with Pt at time of discharge

## 2019-06-08 NOTE — Discharge Summary (Signed)
  Date of admission: 06/07/2019  Date of discharge: 06/08/2019  Admission diagnosis: Right renal neoplasm  Discharge diagnosis: Right renal neoplasm  Secondary diagnoses: Hypertension  History and Physical: For full details, please see admission history and physical. Briefly, Dale Odom is a 48 y.o. year old patient with a small right renal neoplasm.   Hospital Course: He underwent a right RAL partial nephrectomy on 06/07/19.  He remained hemodynamically stable overnight.  He was able to ambulate on POD # 1 and his diet was advanced.  His drain Cr was serum and was removed.  His pain was controlled with oral medication.  He was stable for discharge on POD #1.  Laboratory values:  Recent Labs    06/07/19 1439 06/08/19 0438  HGB 13.8 13.8  HCT 43.3 42.3   Recent Labs    06/08/19 0438  CREATININE 1.67*    Disposition: Home  Discharge instruction: The patient was instructed to be ambulatory but told to refrain from heavy lifting, strenuous activity, or driving.   Discharge medications:  Allergies as of 06/08/2019   No Known Allergies     Medication List    TAKE these medications   acetaminophen 500 MG tablet Commonly known as: TYLENOL Take 1,000 mg by mouth every 6 (six) hours as needed for mild pain or headache.   amLODipine 10 MG tablet Commonly known as: NORVASC Take 1 tablet (10 mg total) by mouth at bedtime.   olmesartan 40 MG tablet Commonly known as: BENICAR Take 1 tablet (40 mg total) by mouth daily.   oxybutynin 10 MG 24 hr tablet Commonly known as: DITROPAN-XL Take 10 mg by mouth daily.   rosuvastatin 40 MG tablet Commonly known as: CRESTOR TAKE 1 TABLET BY MOUTH ONCE DAILY   traMADol 50 MG tablet Commonly known as: Ultram Take 1-2 tablets (50-100 mg total) by mouth every 6 (six) hours as needed for moderate pain or severe pain.   valACYclovir 500 MG tablet Commonly known as: VALTREX TAKE 1 TABLET BY MOUTH TWICE DAILY What changed: when to  take this       Followup:  Follow-up Information    Raynelle Bring, MD On 06/26/2019.   Specialty: Urology Why: at 9:15 Contact information: [place] [address] [phone]

## 2019-06-08 NOTE — Progress Notes (Signed)
Patient ID: Dale Odom, male   DOB: 1971/06/04, 48 y.o.   MRN: [id number]  1 Day Post-Op Subjective: Doing well.  Some nausea overnight. No flatus yet.  Objective: Vital signs in last 24 hours: Temp:  [97.6 F (36.4 C)-99.1 F (37.3 C)] 98.9 F (37.2 C) (10/02 0631) Pulse Rate:  [70-92] 81 (10/02 0631) Resp:  [12-23] 18 (10/02 0433) BP: (137-179)/(87-107) 153/97 (10/02 0631) SpO2:  [97 %-100 %] 98 % (10/02 0433) Weight:  [108.9 kg] 108.9 kg (10/01 0947)  Intake/Output from previous day: 10/01 0701 - 10/02 0700 In: 3808.5 [P.O.:200; I.V.:2732.9; IV Piggyback:360.7] Out: 1775 [Urine:1575; Drains:80; Blood:120] Intake/Output this shift: No intake/output data recorded.  Physical Exam:  General: Alert and oriented Abdomen: Soft, Mild distention Incisions: C/D/I Ext: NT, No erythema  Lab Results: Recent Labs    06/07/19 1439 06/08/19 0438  HGB 13.8 13.8  HCT 43.3 42.3   BMET Recent Labs    06/08/19 0438  NA 135  K 4.2  CL 104  CO2 21*  GLUCOSE 150*  BUN 13  CREATININE 1.67*  CALCIUM 8.8*     Studies/Results: No results found.  Assessment/Plan: POD # 1 s/p right RAL partial nephrectomy - Ambulate, IS, DVT prophylaxis - D/C Foley - Restart home ACE-inhibitor - Advance diet - Check drain Cr - Decrease IVF - Will re-evaluate later today for discharge   LOS: 0 days   Dutch Gray 06/08/2019, 7:58 AM

## 2019-06-25 MED FILL — SILDENAFIL CITRATE 20 MG TA: 20 | 18 days supply | Qty: 90 | Fill #2

## 2019-06-25 MED FILL — VALACYCLOVIR HCL 500 MG TAB: 500 | 90 days supply | Qty: 180 | Fill #1

## 2019-06-25 MED FILL — OLMESARTAN MEDOXOMIL 40 MG: 40 | 30 days supply | Qty: 30 | Fill #2

## 2019-06-26 DIAGNOSIS — C641 Malignant neoplasm of right kidney, except renal pelvis: Secondary | ICD-10-CM | POA: Diagnosis not present

## 2019-07-02 ENCOUNTER — Encounter: Payer: 59 | Admitting: Internal Medicine

## 2019-07-10 MED FILL — OXYBUTYNIN CL ER 10 MG TAB: 10 | 30 days supply | Qty: 30 | Fill #0

## 2019-07-25 MED FILL — AMLODIPINE BESYLATE 10 MG T: 10 | 90 days supply | Qty: 90 | Fill #0

## 2019-07-25 MED FILL — OLMESARTAN MEDOXOMIL 40 MG: 40 | 30 days supply | Qty: 30 | Fill #3

## 2019-07-30 MED FILL — SILDENAFIL CITRATE 20 MG TA: 20 | 20 days supply | Qty: 90 | Fill #3

## 2019-08-06 ENCOUNTER — Other Ambulatory Visit: Payer: Self-pay

## 2019-08-06 ENCOUNTER — Encounter: Payer: Self-pay | Admitting: Internal Medicine

## 2019-08-06 ENCOUNTER — Ambulatory Visit (INDEPENDENT_AMBULATORY_CARE_PROVIDER_SITE_OTHER): Payer: 59 | Admitting: Internal Medicine

## 2019-08-06 VITALS — BP 146/92 | HR 72 | Temp 98.2°F | Ht 74.0 in | Wt 244.2 lb

## 2019-08-06 DIAGNOSIS — I129 Hypertensive chronic kidney disease with stage 1 through stage 4 chronic kidney disease, or unspecified chronic kidney disease: Secondary | ICD-10-CM

## 2019-08-06 DIAGNOSIS — Z0000 Encounter for general adult medical examination without abnormal findings: Secondary | ICD-10-CM | POA: Diagnosis not present

## 2019-08-06 DIAGNOSIS — Z6831 Body mass index (BMI) 31.0-31.9, adult: Secondary | ICD-10-CM

## 2019-08-06 DIAGNOSIS — N182 Chronic kidney disease, stage 2 (mild): Secondary | ICD-10-CM

## 2019-08-06 DIAGNOSIS — E6609 Other obesity due to excess calories: Secondary | ICD-10-CM

## 2019-08-06 LAB — POCT URINALYSIS DIPSTICK
Bilirubin, UA: NEGATIVE
Glucose, UA: NEGATIVE
Leukocytes, UA: NEGATIVE
Nitrite, UA: NEGATIVE
Protein, UA: POSITIVE — AB
Spec Grav, UA: >=1.03 — AB (ref 1.010–1.025)
Urobilinogen, UA: 0.2 E.U./dL
pH, UA: 5.5 (ref 5.0–8.0)

## 2019-08-06 LAB — POCT UA - MICROALBUMIN
Creatinine, POC: 300 mg/dL
Microalbumin Ur, POC: 150 mg/L

## 2019-08-06 MED ORDER — HYDROCHLOROTHIAZIDE 12.5 MG PO CAPS: 12.5000 mg | ORAL_CAPSULE | Freq: Every day | ORAL | 1 refills | Status: DC

## 2019-08-06 MED FILL — HYDROCHLOROTHIAZIDE 12.5 MG: 12.5 | 90 days supply | Qty: 90 | Fill #0

## 2019-08-06 MED FILL — OXYBUTYNIN CL ER 10 MG TAB: 10 | 30 days supply | Qty: 30 | Fill #1

## 2019-08-06 NOTE — Progress Notes (Signed)
Subjective:     Patient ID: Dale Odom , male    DOB: Jul 04, 1971 , 48 y.o.   MRN: [id number]   Chief Complaint  Patient presents with  . Annual Exam  . Hypertension    HPI  He is here today for a full physical examination. He is concerned about his blood pressure. States it has been elevated. He denies headaches, chest pain and SOB. He does admit to recent diagnosis of renal cell carcinoma. He underwent partial nephrectomy October 1st. He is now back at work. He has no complaints related to this procedure/diagnosis. He is thankful it was caught early.    Hypertension This is a chronic problem. The current episode started more than 1 year ago. The problem has been gradually improving since onset. The problem is controlled. Pertinent negatives include no blurred vision, chest pain, palpitations or shortness of breath. Past treatments include diuretics, angiotensin blockers and calcium channel blockers. The current treatment provides moderate improvement. Compliance problems include exercise.  Hypertensive end-organ damage includes kidney disease.     Past Medical History:  Diagnosis Date  . Cancer ([place])    right kidney  . Chronic kidney disease    CKD stage 3  . Hypertension   . Pre-diabetes    hx of no longer     Family History  Problem Relation Age of Onset  . Hypertension Mother   . Hyperlipidemia Mother   . Heart Problems Father   . Prostate cancer Father      Current Outpatient Medications:  .  acetaminophen (TYLENOL) 500 MG tablet, Take 1,000 mg by mouth every 6 (six) hours as needed for mild pain or headache., Disp: , Rfl:  .  amLODipine (NORVASC) 10 MG tablet, Take 1 tablet (10 mg total) by mouth at bedtime., Disp: 90 tablet, Rfl: 0 .  olmesartan (BENICAR) 40 MG tablet, Take 1 tablet (40 mg total) by mouth daily., Disp: 90 tablet, Rfl: 1 .  oxybutynin (DITROPAN-XL) 10 MG 24 hr tablet, Take 10 mg by mouth daily., Disp: , Rfl:  .  valACYclovir (VALTREX) 500 MG  tablet, TAKE 1 TABLET BY MOUTH TWICE DAILY (Patient taking differently: Take 500 mg by mouth daily. ), Disp: 180 tablet, Rfl: 2 .  hydrochlorothiazide (MICROZIDE) 12.5 MG capsule, Take 1 capsule (12.5 mg total) by mouth daily., Disp: 90 capsule, Rfl: 1 .  rosuvastatin (CRESTOR) 40 MG tablet, TAKE 1 TABLET BY MOUTH ONCE DAILY (Patient not taking: Reported on 08/06/2019), Disp: 90 tablet, Rfl: 2   No Known Allergies   Review of Systems  Constitutional: Negative.   HENT: Negative.   Eyes: Negative.  Negative for blurred vision.  Respiratory: Negative.  Negative for shortness of breath.   Cardiovascular: Negative.  Negative for chest pain and palpitations.  Endocrine: Negative.   Genitourinary: Negative.   Musculoskeletal: Negative.   Skin: Negative.   Allergic/Immunologic: Negative.   Neurological: Negative.   Hematological: Negative.   Psychiatric/Behavioral: Negative.      Today's Vitals   08/06/19 1036  BP: (!) 146/92  Pulse: 72  Temp: 98.2 F (36.8 C)  TempSrc: Oral  Weight: 244 lb 3.2 oz (110.8 kg)  Height: 6' 2"  (1.88 m)   Body mass index is 31.35 kg/m.   Objective:  Physical Exam Vitals signs and nursing note reviewed.  Constitutional:      Appearance: Normal appearance. He is obese.  HENT:     Head: Normocephalic and atraumatic.     Right Ear: Tympanic membrane, ear  canal and external ear normal.     Left Ear: Tympanic membrane, ear canal and external ear normal.     Nose: Nose normal.     Mouth/Throat:     Mouth: Mucous membranes are moist.     Pharynx: Oropharynx is clear.  Eyes:     Extraocular Movements: Extraocular movements intact.     Conjunctiva/sclera: Conjunctivae normal.     Pupils: Pupils are equal, round, and reactive to light.  Neck:     Musculoskeletal: Normal range of motion and neck supple.  Cardiovascular:     Rate and Rhythm: Normal rate and regular rhythm.     Pulses: Normal pulses.     Heart sounds: Normal heart sounds.  Pulmonary:      Effort: Pulmonary effort is normal.     Breath sounds: Normal breath sounds.  Chest:     Breasts:        Right: Normal. No swelling, bleeding, inverted nipple, mass or nipple discharge.        Left: Normal. No swelling, bleeding, inverted nipple, mass or nipple discharge.  Abdominal:     General: Bowel sounds are normal.     Palpations: Abdomen is soft.     Comments: Rounded, soft.   Genitourinary:    Comments: Deferred, per his request. He is followed by Urology.  Musculoskeletal: Normal range of motion.  Skin:    General: Skin is warm.  Neurological:     General: No focal deficit present.     Mental Status: He is alert.  Psychiatric:        Mood and Affect: Mood normal.        Behavior: Behavior normal.         Assessment And Plan:     1. Routine general medical examination at health care facility  A full exam was performed.  DRE deferred. PATIENT HAS BEEN ADVISED TO GET 30-45 MINUTES REGULAR EXERCISE NO LESS THAN FOUR TO FIVE DAYS PER WEEK - BOTH WEIGHTBEARING EXERCISES AND AEROBIC ARE RECOMMENDED.  HE WAS ADVISED TO FOLLOW A HEALTHY DIET WITH AT LEAST SIX FRUITS/VEGGIES PER DAY, DECREASE INTAKE OF RED MEAT, AND TO INCREASE FISH INTAKE TO TWO DAYS PER WEEK.  MEATS/FISH SHOULD NOT BE FRIED, BAKED OR BROILED IS PREFERABLE.  I SUGGEST WEARING SPF 50 SUNSCREEN ON EXPOSED PARTS AND ESPECIALLY WHEN IN THE DIRECT SUNLIGHT FOR AN EXTENDED PERIOD OF TIME.  PLEASE AVOID FAST FOOD RESTAURANTS AND INCREASE YOUR WATER INTAKE.  - CMP14+EGFR - CBC - Lipid panel - Hemoglobin A1c  2. Hypertensive nephropathy  Chronic, uncontrolled. He was previously on a diuretic, with improved bp control. However, he asked to stop this medication b/c he felt it was worsening his renal function. He reports drinking more fluids now. He agrees to try low dose diuretic. He was also encouraged to incorporate more exercise into his daily routine and limit his salt intake. He will rto in six weeks for  re-evaluation. EKG performed, NSR w/ TWA.   - EKG 12-Lead - POCT Urinalysis Dipstick (81002) - POCT UA - Microalbumin  3. Stage 2 chronic kidney disease, (mild)  He fluctuates between stage 2 and stage 3. He plans to schedule f/u appt with Renal in the near future. He is encouraged to stay well hydrated.   4. Class 1 obesity due to excess calories with serious comorbidity and body mass index (BMI) of 31.0 to 31.9 in adult  He is encouraged to strive to lose ten percent of his body  weight to decrease cardiac risk. He is advised to exercise at least 30 minutes five days per week.   Maximino Greenland, MD    THE PATIENT IS ENCOURAGED TO PRACTICE SOCIAL DISTANCING DUE TO THE COVID-19 PANDEMIC.

## 2019-08-06 NOTE — Patient Instructions (Signed)

## 2019-08-07 LAB — CMP14+EGFR
ALT: 21 IU/L (ref 0–44)
AST: 20 IU/L (ref 0–40)
Albumin/Globulin Ratio: 1.7 (ref 1.2–2.2)
Albumin: 4.1 g/dL (ref 4.0–5.0)
Alkaline Phosphatase: 106 IU/L (ref 39–117)
BUN/Creatinine Ratio: 7 — ABNORMAL LOW (ref 9–20)
BUN: 12 mg/dL (ref 6–24)
Bilirubin Total: <0.2 mg/dL (ref 0.0–1.2)
CO2: 19 mmol/L — ABNORMAL LOW (ref 20–29)
Calcium: 9.2 mg/dL (ref 8.7–10.2)
Chloride: 105 mmol/L (ref 96–106)
Creatinine, Ser: 1.71 mg/dL — ABNORMAL HIGH (ref 0.76–1.27)
GFR calc Af Amer: 53 mL/min/{1.73_m2} — ABNORMAL LOW (ref >59)
GFR calc non Af Amer: 46 mL/min/{1.73_m2} — ABNORMAL LOW (ref >59)
Globulin, Total: 2.4 g/dL (ref 1.5–4.5)
Glucose: 92 mg/dL (ref 65–99)
Potassium: 4.6 mmol/L (ref 3.5–5.2)
Sodium: 138 mmol/L (ref 134–144)
Total Protein: 6.5 g/dL (ref 6.0–8.5)

## 2019-08-07 LAB — CBC
Hematocrit: 42.1 % (ref 37.5–51.0)
Hemoglobin: 13.9 g/dL (ref 13.0–17.7)
MCH: 28.2 pg (ref 26.6–33.0)
MCHC: 33 g/dL (ref 31.5–35.7)
MCV: 85 fL (ref 79–97)
Platelets: 262 10*3/uL (ref 150–450)
RBC: 4.93 x10E6/uL (ref 4.14–5.80)
RDW: 14.4 % (ref 11.6–15.4)
WBC: 6 10*3/uL (ref 3.4–10.8)

## 2019-08-07 LAB — HEMOGLOBIN A1C
Est. average glucose Bld gHb Est-mCnc: 114 mg/dL
Hgb A1c MFr Bld: 5.6 % (ref 4.8–5.6)

## 2019-08-07 LAB — LIPID PANEL
Chol/HDL Ratio: 7.4 ratio — ABNORMAL HIGH (ref 0.0–5.0)
Cholesterol, Total: 267 mg/dL — ABNORMAL HIGH (ref 100–199)
HDL: 36 mg/dL — ABNORMAL LOW (ref >39)
LDL Chol Calc (NIH): 180 mg/dL — ABNORMAL HIGH (ref 0–99)
Triglycerides: 263 mg/dL — ABNORMAL HIGH (ref 0–149)
VLDL Cholesterol Cal: 51 mg/dL — ABNORMAL HIGH (ref 5–40)

## 2019-08-22 MED FILL — SILDENAFIL CITRATE 20 MG TA: 20 | 20 days supply | Qty: 90 | Fill #0

## 2019-08-22 MED FILL — OLMESARTAN MEDOXOMIL 40 MG: 40 | 30 days supply | Qty: 30 | Fill #4

## 2019-08-27 DIAGNOSIS — I129 Hypertensive chronic kidney disease with stage 1 through stage 4 chronic kidney disease, or unspecified chronic kidney disease: Secondary | ICD-10-CM | POA: Diagnosis not present

## 2019-08-27 DIAGNOSIS — N2581 Secondary hyperparathyroidism of renal origin: Secondary | ICD-10-CM | POA: Diagnosis not present

## 2019-08-27 DIAGNOSIS — E669 Obesity, unspecified: Secondary | ICD-10-CM | POA: Diagnosis not present

## 2019-08-27 DIAGNOSIS — E559 Vitamin D deficiency, unspecified: Secondary | ICD-10-CM | POA: Diagnosis not present

## 2019-08-27 DIAGNOSIS — N183 Chronic kidney disease, stage 3 unspecified: Secondary | ICD-10-CM | POA: Diagnosis not present

## 2019-08-27 DIAGNOSIS — D631 Anemia in chronic kidney disease: Secondary | ICD-10-CM | POA: Diagnosis not present

## 2019-08-27 DIAGNOSIS — E7800 Pure hypercholesterolemia, unspecified: Secondary | ICD-10-CM | POA: Diagnosis not present

## 2019-08-27 DIAGNOSIS — E1129 Type 2 diabetes mellitus with other diabetic kidney complication: Secondary | ICD-10-CM | POA: Diagnosis not present

## 2019-08-27 DIAGNOSIS — N189 Chronic kidney disease, unspecified: Secondary | ICD-10-CM | POA: Diagnosis not present

## 2019-08-30 ENCOUNTER — Encounter: Payer: Self-pay | Admitting: Internal Medicine

## 2019-09-03 ENCOUNTER — Telehealth: Payer: Self-pay

## 2019-09-03 MED ORDER — ROSUVASTATIN CALCIUM 20 MG PO TABS: 20.0000 mg | ORAL_TABLET | Freq: Every day | ORAL | 1 refills | Status: DC

## 2019-09-03 MED FILL — ROSUVASTATIN CALCIUM 20 MG: 20 | 30 days supply | Qty: 30 | Fill #0

## 2019-09-03 NOTE — Telephone Encounter (Signed)
Prescription for rosuvastatin 20 mg faxed to the pt's pharmacy.

## 2019-09-05 MED FILL — OXYBUTYNIN CL ER 10 MG TAB: 10 | 30 days supply | Qty: 30 | Fill #2

## 2019-09-17 ENCOUNTER — Encounter: Payer: Self-pay | Admitting: Internal Medicine

## 2019-09-17 ENCOUNTER — Other Ambulatory Visit: Payer: Self-pay

## 2019-09-17 ENCOUNTER — Ambulatory Visit (INDEPENDENT_AMBULATORY_CARE_PROVIDER_SITE_OTHER): Payer: 59 | Admitting: Internal Medicine

## 2019-09-17 VITALS — BP 112/84 | HR 96 | Temp 98.3°F | Wt 239.6 lb

## 2019-09-17 DIAGNOSIS — Z79899 Other long term (current) drug therapy: Secondary | ICD-10-CM

## 2019-09-17 DIAGNOSIS — N183 Chronic kidney disease, stage 3 unspecified: Secondary | ICD-10-CM

## 2019-09-17 DIAGNOSIS — I129 Hypertensive chronic kidney disease with stage 1 through stage 4 chronic kidney disease, or unspecified chronic kidney disease: Secondary | ICD-10-CM | POA: Diagnosis not present

## 2019-09-17 NOTE — Patient Instructions (Signed)
Exercising to Stay Healthy To become healthy and stay healthy, it is recommended that you do moderate-intensity and vigorous-intensity exercise. You can tell that you are exercising at a moderate intensity if your heart starts beating faster and you start breathing faster but can still hold a conversation. You can tell that you are exercising at a vigorous intensity if you are breathing much harder and faster and cannot hold a conversation while exercising. Exercising regularly is important. It has many health benefits, such as:  Improving overall fitness, flexibility, and endurance.  Increasing bone density.  Helping with weight control.  Decreasing body fat.  Increasing muscle strength.  Reducing stress and tension.  Improving overall health. How often should I exercise? Choose an activity that you enjoy, and set realistic goals. Your health care provider can help you make an activity plan that works for you. Exercise regularly as told by your health care provider. This may include:  Doing strength training two times a week, such as: ? Lifting weights. ? Using resistance bands. ? Push-ups. ? Sit-ups. ? Yoga.  Doing a certain intensity of exercise for a given amount of time. Choose from these options: ? A total of 150 minutes of moderate-intensity exercise every week. ? A total of 75 minutes of vigorous-intensity exercise every week. ? A mix of moderate-intensity and vigorous-intensity exercise every week. Children, pregnant women, people who have not exercised regularly, people who are overweight, and older adults may need to talk with a health care provider about what activities are safe to do. If you have a medical condition, be sure to talk with your health care provider before you start a new exercise program. What are some exercise ideas? Moderate-intensity exercise ideas include:  Walking 1 mile (1.6 km) in about 15  minutes.  Biking.  Hiking.  Golfing.  Dancing.  Water aerobics. Vigorous-intensity exercise ideas include:  Walking 4.5 miles (7.2 km) or more in about 1 hour.  Jogging or running 5 miles (8 km) in about 1 hour.  Biking 10 miles (16.1 km) or more in about 1 hour.  Lap swimming.  Roller-skating or in-line skating.  Cross-country skiing.  Vigorous competitive sports, such as football, basketball, and soccer.  Jumping rope.  Aerobic dancing. What are some everyday activities that can help me to get exercise?  Yard work, such as: ? Pushing a lawn mower. ? Raking and bagging leaves.  Washing your car.  Pushing a stroller.  Shoveling snow.  Gardening.  Washing windows or floors. How can I be more active in my day-to-day activities?  Use stairs instead of an elevator.  Take a walk during your lunch break.  If you drive, park your car farther away from your work or school.  If you take public transportation, get off one stop early and walk the rest of the way.  Stand up or walk around during all of your indoor phone calls.  Get up, stretch, and walk around every 30 minutes throughout the day.  Enjoy exercise with a friend. Support to continue exercising will help you keep a regular routine of activity. What guidelines can I follow while exercising?  Before you start a new exercise program, talk with your health care provider.  Do not exercise so much that you hurt yourself, feel dizzy, or get very short of breath.  Wear comfortable clothes and wear shoes with good support.  Drink plenty of water while you exercise to prevent dehydration or heat stroke.  Work out until your breathing   and your heartbeat get faster. Where to find more information  [place].S[place] Department of Health and Human Services: www.hhs.gov  Centers for Disease Control and Prevention (CDC): www.cdc.gov Summary  Exercising regularly is important. It will improve your overall fitness,  flexibility, and endurance.  Regular exercise also will improve your overall health. It can help you control your weight, reduce stress, and improve your bone density.  Do not exercise so much that you hurt yourself, feel dizzy, or get very short of breath.  Before you start a new exercise program, talk with your health care provider. This information is not intended to replace advice given to you by your health care provider. Make sure you discuss any questions you have with your health care provider. Document Revised: 08/05/2017 Document Reviewed: 07/14/2017 Elsevier Patient Education  2020 [employer]  

## 2019-09-17 NOTE — Progress Notes (Signed)
This visit occurred during the SARS-CoV-2 public health emergency.  Safety protocols were in place, including screening questions prior to the visit, additional usage of staff PPE, and extensive cleaning of exam room while observing appropriate contact time as indicated for disinfecting solutions.  Subjective:     Patient ID: Dale Odom , male    DOB: 09/14/1970 , 49 y.o.   MRN: [id number]   Chief Complaint  Patient presents with  . Hyperlipidemia  . Hypertension    HPI  He is here today for a blood pressure check. He was started on diuretic at his last visit, which he tolerated without any issues. However, he has since been evaluated by Renal, and they stopped his medication once his bloodwork was reviewed. He is not sure why the medication was stopped. He has not had any issues since stopping the medication. He reports he and his wife are on a new eating/exercise plan b/c they both want to live healthier.     Past Medical History:  Diagnosis Date  . Cancer ([place])    right kidney  . Chronic kidney disease    CKD stage 3  . Hypertension   . Pre-diabetes    hx of no longer     Family History  Problem Relation Age of Onset  . Hypertension Mother   . Hyperlipidemia Mother   . Heart Problems Father   . Prostate cancer Father      Current Outpatient Medications:  .  acetaminophen (TYLENOL) 500 MG tablet, Take 1,000 mg by mouth every 6 (six) hours as needed for mild pain or headache., Disp: , Rfl:  .  amLODipine (NORVASC) 10 MG tablet, Take 1 tablet (10 mg total) by mouth at bedtime., Disp: 90 tablet, Rfl: 0 .  olmesartan (BENICAR) 40 MG tablet, Take 1 tablet (40 mg total) by mouth daily., Disp: 90 tablet, Rfl: 1 .  oxybutynin (DITROPAN-XL) 10 MG 24 hr tablet, Take 10 mg by mouth daily., Disp: , Rfl:  .  rosuvastatin (CRESTOR) 20 MG tablet, Take 1 tablet (20 mg total) by mouth daily., Disp: 30 tablet, Rfl: 1 .  valACYclovir (VALTREX) 500 MG tablet, TAKE 1 TABLET BY MOUTH  TWICE DAILY (Patient taking differently: Take 500 mg by mouth daily. ), Disp: 180 tablet, Rfl: 2   No Known Allergies   Review of Systems  Constitutional: Negative.   Respiratory: Negative.   Cardiovascular: Negative.   Gastrointestinal: Negative.   Neurological: Negative.   Psychiatric/Behavioral: Negative.      Today's Vitals   09/17/19 1151  BP: 112/84  Pulse: 96  Temp: 98.3 F (36.8 C)  TempSrc: Oral  Weight: 239 lb 9.6 oz (108.7 kg)   Body mass index is 30.76 kg/m.   Objective:  Physical Exam Vitals and nursing note reviewed.  Constitutional:      Appearance: Normal appearance.  Cardiovascular:     Rate and Rhythm: Normal rate and regular rhythm.     Heart sounds: Normal heart sounds.  Pulmonary:     Effort: Pulmonary effort is normal.     Breath sounds: Normal breath sounds.  Skin:    General: Skin is warm.  Neurological:     General: No focal deficit present.     Mental Status: He is alert.  Psychiatric:        Mood and Affect: Mood normal.         Assessment And Plan:     1. Hypertensive nephropathy  Chronic, well controlled. He will continue  with current meds. He is encouraged to avoid adding salt to his foods. He will rto in June 2021 as previously scheduled.   2. Stage 3 chronic kidney disease, unspecified whether stage 3a or 3b CKD  Chronic. I will request his records from Dr. Justin Mend - most recent progress notes and labwork. I initially planned to check BMP today, but I will cancel order since he has had recent labwork. He is encouraged to keep BP well controlled and to stay well hydrated to avoid progression of his CKD.   3. Drug therapy   Maximino Greenland, MD    THE PATIENT IS ENCOURAGED TO PRACTICE SOCIAL DISTANCING DUE TO THE COVID-19 PANDEMIC.

## 2019-09-19 MED FILL — OLMESARTAN MEDOXOMIL 40 MG: 40 | 30 days supply | Qty: 30 | Fill #5

## 2019-09-19 MED FILL — SILDENAFIL CITRATE 20 MG TA: 20 | 20 days supply | Qty: 90 | Fill #1

## 2019-10-03 ENCOUNTER — Other Ambulatory Visit: Payer: Self-pay | Admitting: Internal Medicine

## 2019-10-03 MED FILL — ROSUVASTATIN CALCIUM 20 MG: 20 | 30 days supply | Qty: 30 | Fill #1

## 2019-10-03 MED FILL — OXYBUTYNIN CL ER 10 MG TAB: 10 | 90 days supply | Qty: 90 | Fill #3

## 2019-10-18 MED FILL — SILDENAFIL CITRATE 20 MG TA: 20 | 20 days supply | Qty: 90 | Fill #2

## 2019-10-24 ENCOUNTER — Other Ambulatory Visit: Payer: Self-pay | Admitting: Internal Medicine

## 2019-10-24 MED FILL — OLMESARTAN MEDOXOMIL 40 MG: 40 | 90 days supply | Qty: 90 | Fill #0

## 2019-11-26 ENCOUNTER — Other Ambulatory Visit: Payer: Self-pay | Admitting: Internal Medicine

## 2019-11-26 MED FILL — SILDENAFIL CITRATE 20 MG TA: 20 | 20 days supply | Qty: 90 | Fill #3

## 2019-11-26 MED FILL — AMLODIPINE BESYLATE 10 MG T: 10 | 90 days supply | Qty: 90 | Fill #0

## 2019-12-13 MED FILL — ROSUVASTATIN CALCIUM 20 MG: 20 | 30 days supply | Qty: 30 | Fill #0

## 2019-12-13 MED FILL — SILDENAFIL CITRATE 20 MG TA: 20 | 20 days supply | Qty: 90 | Fill #0

## 2019-12-19 DIAGNOSIS — H524 Presbyopia: Secondary | ICD-10-CM | POA: Diagnosis not present

## 2019-12-24 ENCOUNTER — Other Ambulatory Visit (HOSPITAL_COMMUNITY): Payer: Self-pay | Admitting: Urology

## 2019-12-24 ENCOUNTER — Other Ambulatory Visit: Payer: Self-pay

## 2019-12-24 ENCOUNTER — Ambulatory Visit (HOSPITAL_COMMUNITY)
Admission: RE | Admit: 2019-12-24 | Discharge: 2019-12-24 | Disposition: A | Discharge status: Home / Self Care | Payer: 59 | Source: Ambulatory Visit | Attending: Urology | Admitting: Urology

## 2019-12-24 DIAGNOSIS — C641 Malignant neoplasm of right kidney, except renal pelvis: Secondary | ICD-10-CM | POA: Insufficient documentation

## 2019-12-24 DIAGNOSIS — C649 Malignant neoplasm of unspecified kidney, except renal pelvis: Secondary | ICD-10-CM | POA: Diagnosis not present

## 2019-12-28 ENCOUNTER — Other Ambulatory Visit (HOSPITAL_COMMUNITY): Payer: Self-pay | Admitting: Urology

## 2019-12-28 DIAGNOSIS — N5201 Erectile dysfunction due to arterial insufficiency: Secondary | ICD-10-CM | POA: Diagnosis not present

## 2019-12-28 DIAGNOSIS — C641 Malignant neoplasm of right kidney, except renal pelvis: Secondary | ICD-10-CM | POA: Diagnosis not present

## 2019-12-28 MED FILL — SILDENAFIL CITRATE 20 MG TA: 20 | 18 days supply | Qty: 90 | Fill #0

## 2020-01-09 ENCOUNTER — Other Ambulatory Visit: Payer: Self-pay | Admitting: Internal Medicine

## 2020-01-09 MED FILL — ROSUVASTATIN CALCIUM 20 MG: 20 | 30 days supply | Qty: 30 | Fill #1

## 2020-01-09 MED FILL — VALACYCLOVIR HCL 500 MG TAB: 500 | 90 days supply | Qty: 180 | Fill #0

## 2020-01-09 MED FILL — OXYBUTYNIN CL ER 10 MG TAB: 10 | 90 days supply | Qty: 90 | Fill #4

## 2020-02-11 ENCOUNTER — Encounter: Payer: Self-pay | Admitting: Internal Medicine

## 2020-02-11 ENCOUNTER — Ambulatory Visit: Payer: 59 | Admitting: Internal Medicine

## 2020-02-11 ENCOUNTER — Other Ambulatory Visit: Payer: Self-pay

## 2020-02-11 ENCOUNTER — Other Ambulatory Visit: Payer: Self-pay | Admitting: Internal Medicine

## 2020-02-11 VITALS — BP 132/80 | HR 90 | Temp 98.2°F | Ht 74.0 in | Wt 239.8 lb

## 2020-02-11 DIAGNOSIS — I129 Hypertensive chronic kidney disease with stage 1 through stage 4 chronic kidney disease, or unspecified chronic kidney disease: Secondary | ICD-10-CM | POA: Diagnosis not present

## 2020-02-11 DIAGNOSIS — M25562 Pain in left knee: Secondary | ICD-10-CM

## 2020-02-11 DIAGNOSIS — Z6830 Body mass index (BMI) 30.0-30.9, adult: Secondary | ICD-10-CM | POA: Diagnosis not present

## 2020-02-11 DIAGNOSIS — E7800 Pure hypercholesterolemia, unspecified: Secondary | ICD-10-CM

## 2020-02-11 DIAGNOSIS — E6609 Other obesity due to excess calories: Secondary | ICD-10-CM

## 2020-02-11 DIAGNOSIS — N183 Chronic kidney disease, stage 3 unspecified: Secondary | ICD-10-CM

## 2020-02-11 MED ORDER — OLMESARTAN MEDOXOMIL 40 MG PO TABS: 40.0000 mg | ORAL_TABLET | Freq: Every day | ORAL | 2 refills | Status: DC

## 2020-02-11 MED ORDER — AMLODIPINE BESYLATE 10 MG PO TABS: 10.0000 mg | ORAL_TABLET | Freq: Every day | ORAL | 2 refills | Status: DC

## 2020-02-11 NOTE — Progress Notes (Signed)
This visit occurred during the SARS-CoV-2 public health emergency.  Safety protocols were in place, including screening questions prior to the visit, additional usage of staff PPE, and extensive cleaning of exam room while observing appropriate contact time as indicated for disinfecting solutions.  Subjective:     Patient ID: Dale Odom , male    DOB: [birth year] , 49 y.o.   MRN: [id number]   Chief Complaint  Patient presents with  . Hypertension    HPI  He is here today for bp check.  He reports compliance with meds. He denies headaches, chest pain and visual disturbances.   Hypertension This is a chronic problem. The current episode started more than 1 year ago. The problem has been gradually improving since onset. The problem is uncontrolled. Pertinent negatives include no blurred vision or chest pain. Past treatments include angiotensin blockers and calcium channel blockers. The current treatment provides moderate improvement. Compliance problems include exercise.  Hypertensive end-organ damage includes kidney disease.     Past Medical History:  Diagnosis Date  . Cancer ([place])    right kidney  . Chronic kidney disease    CKD stage 3  . Hypertension   . Pre-diabetes    hx of no longer     Family History  Problem Relation Age of Onset  . Hypertension Mother   . Hyperlipidemia Mother   . Heart Problems Father   . Prostate cancer Father   . Dementia Father      Current Outpatient Medications:  .  acetaminophen (TYLENOL) 500 MG tablet, Take 1,000 mg by mouth every 6 (six) hours as needed for mild pain or headache., Disp: , Rfl:  .  amLODipine (NORVASC) 10 MG tablet, Take 1 tablet (10 mg total) by mouth at bedtime., Disp: 90 tablet, Rfl: 2 .  Chlorpheniramine Maleate (ALLERGY PO), Take by mouth., Disp: , Rfl:  .  olmesartan (BENICAR) 40 MG tablet, Take 1 tablet (40 mg total) by mouth daily., Disp: 90 tablet, Rfl: 2 .  oxybutynin (DITROPAN-XL) 10 MG 24 hr tablet, Take 10  mg by mouth daily., Disp: , Rfl:  .  rosuvastatin (CRESTOR) 20 MG tablet, TAKE 1 TABLET BY MOUTH ONCE DAILY, Disp: 30 tablet, Rfl: 1 .  sildenafil (REVATIO) 20 MG tablet, SMARTSIG:5 Tablet(s) By Mouth As Directed, Disp: , Rfl:  .  valACYclovir (VALTREX) 500 MG tablet, TAKE 1 TABLET BY MOUTH TWICE DAILY, Disp: 180 tablet, Rfl: 2   No Known Allergies   Review of Systems  Constitutional: Negative.   Eyes: Negative for blurred vision.  Respiratory: Negative.   Cardiovascular: Negative.  Negative for chest pain.  Gastrointestinal: Negative.   Musculoskeletal: Positive for arthralgias.       He c/o left knee pain. There is pain with ambulation. Denies fall/trauma. He reports pain is over the kneecap.   Neurological: Negative.   Psychiatric/Behavioral: Negative.      Today's Vitals   02/11/20 1004  BP: 132/80  Pulse: 90  Temp: 98.2 F (36.8 C)  TempSrc: Oral  Weight: 239 lb 12.8 oz (108.8 kg)  Height: _0  (1.88 m)   Body mass index is 30.79 kg/m.   Objective:  Physical Exam Vitals and nursing note reviewed.  Constitutional:      Appearance: Normal appearance.  Cardiovascular:     Rate and Rhythm: Normal rate and regular rhythm.     Heart sounds: Normal heart sounds.  Pulmonary:     Effort: Pulmonary effort is normal.     Breath  sounds: Normal breath sounds.  Skin:    General: Skin is warm.  Neurological:     General: No focal deficit present.     Mental Status: He is alert.  Psychiatric:        Mood and Affect: Mood normal.         Assessment And Plan:     1. Hypertensive nephropathy  Chronic, controlled. He will continue with current meds. He is encouraged to avoid adding salt to his foods. He will rto in six months for his next physical exam. I will check renal function today.   - CMP14+EGFR  2. Stage 3 chronic kidney disease, unspecified whether stage 3a or 3b CKD  Chronic, he is aware of importance of maintaining adequate hydration  3. Pure  hypercholesterolemia  Chronic, November results reviewed in full detail. LDL 180. I will recheck lipid panel today. Pt also advised that we may need to change his meds due to his renal function.   - Lipid panel  4. Acute pain of left knee  He has upcoming appt on Saturday with Ortho. He is also advised to apply Voltaren gel to affected area twice daily as needed.   5. Class 1 obesity due to excess calories with serious comorbidity and body mass index (BMI) of 30.0 to 30.9 in adult  He is currently exercising 3 days per week. He is encouraged to work up to 30-45 minutes five days per week.   Maximino Greenland, MD    THE PATIENT IS ENCOURAGED TO PRACTICE SOCIAL DISTANCING DUE TO THE COVID-19 PANDEMIC.

## 2020-02-11 NOTE — Patient Instructions (Signed)
Voltaren Gel- apply to front/back/sides of  Left knee twice daily as needed  Acute Knee Pain, Adult Many things can cause knee pain. Sometimes, knee pain is sudden (acute) and may be caused by damage, swelling, or irritation of the muscles and tissues that support your knee. The pain often goes away on its own with time and rest. If the pain does not go away, tests may be done to find out what is causing the pain. Follow these instructions at home: Pay attention to any changes in your symptoms. Take these actions to relieve your pain. If you have a knee sleeve or brace:   Wear the sleeve or brace as told by your doctor. Remove it only as told by your doctor.  Loosen the sleeve or brace if your toes: ? Tingle. ? Become numb. ? Turn cold and blue.  Keep the sleeve or brace clean.  If the sleeve or brace is not waterproof: ? Do not let it get wet. ? Cover it with a watertight covering when you take a bath or shower. Activity  Rest your knee.  Do not do things that cause pain.  Avoid activities where both feet leave the ground at the same time (high-impact activities). Examples are running, jumping rope, and doing jumping jacks.  Work with a physical therapist to make a safe exercise program, as told by your doctor. Managing pain, stiffness, and swelling   If told, put ice on the knee: ? Put ice in a plastic bag. ? Place a towel between your skin and the bag. ? Leave the ice on for 20 minutes, 2-3 times a day.  If told, put pressure (compression) on your injured knee to control swelling, give support, and help with discomfort. Compression may be done with an elastic bandage. General instructions  Take all medicines only as told by your doctor.  Raise (elevate) your knee while you are sitting or lying down. Make sure your knee is higher than your heart.  Sleep with a pillow under your knee.  Do not use any products that contain nicotine or tobacco. These include  cigarettes, e-cigarettes, and chewing tobacco. These products may slow down healing. If you need help quitting, ask your doctor.  If you are overweight, work with your doctor and a food expert (dietitian) to set goals to lose weight. Being overweight can make your knee hurt more.  Keep all follow-up visits as told by your doctor. This is important. Contact a doctor if:  The knee pain does not stop.  The knee pain changes or gets worse.  You have a fever along with knee pain.  Your knee feels warm when you touch it.  Your knee gives out or locks up. Get help right away if:  Your knee swells, and the swelling gets worse.  You cannot move your knee.  You have very bad knee pain. Summary  Many things can cause knee pain. The pain often goes away on its own with time and rest.  Your doctor may do tests to find out the cause of the pain.  Pay attention to any changes in your symptoms. Relieve your pain with rest, medicines, light activity, and use of ice.  Get help right away if you cannot move your knee or your knee pain is very bad. This information is not intended to replace advice given to you by your health care provider. Make sure you discuss any questions you have with your health care provider. Document Revised: 02/02/2018 Document  Reviewed: 02/02/2018 Elsevier Patient Education  [employer].

## 2020-02-12 ENCOUNTER — Encounter: Payer: Self-pay | Admitting: Internal Medicine

## 2020-02-12 LAB — LIPID PANEL
Chol/HDL Ratio: 5.1 ratio — ABNORMAL HIGH (ref 0.0–5.0)
Cholesterol, Total: 178 mg/dL (ref 100–199)
HDL: 35 mg/dL — ABNORMAL LOW (ref >39)
LDL Chol Calc (NIH): 105 mg/dL — ABNORMAL HIGH (ref 0–99)
Triglycerides: 218 mg/dL — ABNORMAL HIGH (ref 0–149)
VLDL Cholesterol Cal: 38 mg/dL (ref 5–40)

## 2020-02-12 LAB — CMP14+EGFR
ALT: 23 IU/L (ref 0–44)
AST: 25 IU/L (ref 0–40)
Albumin/Globulin Ratio: 1.7 (ref 1.2–2.2)
Albumin: 3.9 g/dL — ABNORMAL LOW (ref 4.0–5.0)
Alkaline Phosphatase: 97 IU/L (ref 48–121)
BUN/Creatinine Ratio: 9 (ref 9–20)
BUN: 15 mg/dL (ref 6–24)
Bilirubin Total: <0.2 mg/dL (ref 0.0–1.2)
CO2: 20 mmol/L (ref 20–29)
Calcium: 8.7 mg/dL (ref 8.7–10.2)
Chloride: 108 mmol/L — ABNORMAL HIGH (ref 96–106)
Creatinine, Ser: 1.76 mg/dL — ABNORMAL HIGH (ref 0.76–1.27)
GFR calc Af Amer: 51 mL/min/{1.73_m2} — ABNORMAL LOW (ref >59)
GFR calc non Af Amer: 44 mL/min/{1.73_m2} — ABNORMAL LOW (ref >59)
Globulin, Total: 2.3 g/dL (ref 1.5–4.5)
Glucose: 96 mg/dL (ref 65–99)
Potassium: 4.5 mmol/L (ref 3.5–5.2)
Sodium: 141 mmol/L (ref 134–144)
Total Protein: 6.2 g/dL (ref 6.0–8.5)

## 2020-02-16 DIAGNOSIS — M25562 Pain in left knee: Secondary | ICD-10-CM | POA: Diagnosis not present

## 2020-03-06 ENCOUNTER — Other Ambulatory Visit: Payer: Self-pay | Admitting: Internal Medicine

## 2020-03-06 MED FILL — ROSUVASTATIN CALCIUM 20 MG: 20 | 30 days supply | Qty: 30 | Fill #0

## 2020-04-16 ENCOUNTER — Other Ambulatory Visit (HOSPITAL_COMMUNITY): Payer: Self-pay | Admitting: Nurse Practitioner

## 2020-04-16 MED FILL — OXYBUTYNIN CL ER 10 MG TAB: 10 | 90 days supply | Qty: 90 | Fill #0

## 2020-04-16 MED FILL — SILDENAFIL CITRATE 20 MG TA: 20 | 18 days supply | Qty: 90 | Fill #1

## 2020-04-16 MED FILL — ROSUVASTATIN CALCIUM 20 MG: 20 | 30 days supply | Qty: 30 | Fill #1

## 2020-04-16 MED FILL — OLMESARTAN MEDOXOMIL 40 MG: 40 | 90 days supply | Qty: 90 | Fill #0

## 2020-05-16 ENCOUNTER — Other Ambulatory Visit: Payer: Self-pay | Admitting: Internal Medicine

## 2020-05-16 MED FILL — SILDENAFIL CITRATE 20 MG TA: 20 | 18 days supply | Qty: 90 | Fill #2

## 2020-05-16 MED FILL — ROSUVASTATIN CALCIUM 20 MG: 20 | 30 days supply | Qty: 30 | Fill #0

## 2020-06-30 DIAGNOSIS — C641 Malignant neoplasm of right kidney, except renal pelvis: Secondary | ICD-10-CM | POA: Diagnosis not present

## 2020-07-07 MED FILL — AMLODIPINE BESYLATE 10 MG T: 10 | 90 days supply | Qty: 90 | Fill #1

## 2020-07-07 MED FILL — ROSUVASTATIN CALCIUM 20 MG: 20 | 30 days supply | Qty: 30 | Fill #1

## 2020-07-14 DIAGNOSIS — C641 Malignant neoplasm of right kidney, except renal pelvis: Secondary | ICD-10-CM | POA: Diagnosis not present

## 2020-07-14 DIAGNOSIS — E1129 Type 2 diabetes mellitus with other diabetic kidney complication: Secondary | ICD-10-CM | POA: Diagnosis not present

## 2020-07-14 DIAGNOSIS — N2581 Secondary hyperparathyroidism of renal origin: Secondary | ICD-10-CM | POA: Diagnosis not present

## 2020-07-14 DIAGNOSIS — N189 Chronic kidney disease, unspecified: Secondary | ICD-10-CM | POA: Diagnosis not present

## 2020-07-14 DIAGNOSIS — E669 Obesity, unspecified: Secondary | ICD-10-CM | POA: Diagnosis not present

## 2020-07-14 DIAGNOSIS — E559 Vitamin D deficiency, unspecified: Secondary | ICD-10-CM | POA: Diagnosis not present

## 2020-07-14 DIAGNOSIS — R3121 Asymptomatic microscopic hematuria: Secondary | ICD-10-CM | POA: Diagnosis not present

## 2020-07-14 DIAGNOSIS — N183 Chronic kidney disease, stage 3 unspecified: Secondary | ICD-10-CM | POA: Diagnosis not present

## 2020-07-14 DIAGNOSIS — I129 Hypertensive chronic kidney disease with stage 1 through stage 4 chronic kidney disease, or unspecified chronic kidney disease: Secondary | ICD-10-CM | POA: Diagnosis not present

## 2020-07-14 DIAGNOSIS — D631 Anemia in chronic kidney disease: Secondary | ICD-10-CM | POA: Diagnosis not present

## 2020-07-14 DIAGNOSIS — N5201 Erectile dysfunction due to arterial insufficiency: Secondary | ICD-10-CM | POA: Diagnosis not present

## 2020-07-14 DIAGNOSIS — E7800 Pure hypercholesterolemia, unspecified: Secondary | ICD-10-CM | POA: Diagnosis not present

## 2020-07-23 DIAGNOSIS — C649 Malignant neoplasm of unspecified kidney, except renal pelvis: Secondary | ICD-10-CM | POA: Diagnosis not present

## 2020-07-23 DIAGNOSIS — C641 Malignant neoplasm of right kidney, except renal pelvis: Secondary | ICD-10-CM | POA: Diagnosis not present

## 2020-07-28 ENCOUNTER — Other Ambulatory Visit: Payer: Self-pay | Admitting: Internal Medicine

## 2020-07-28 MED FILL — SILDENAFIL CITRATE 20 MG TA: 20 | 18 days supply | Qty: 90 | Fill #3

## 2020-07-28 MED FILL — OXYBUTYNIN CL ER 10 MG TAB: 10 | 90 days supply | Qty: 90 | Fill #1

## 2020-07-28 MED FILL — OLMESARTAN MEDOXOMIL 40 MG: 40 | 90 days supply | Qty: 90 | Fill #1

## 2020-08-11 MED FILL — ROSUVASTATIN CALCIUM 20 MG: 20 | 30 days supply | Qty: 30 | Fill #0

## 2020-08-25 ENCOUNTER — Encounter: Payer: Self-pay | Admitting: Internal Medicine

## 2020-08-25 ENCOUNTER — Other Ambulatory Visit: Payer: Self-pay

## 2020-08-25 ENCOUNTER — Ambulatory Visit: Payer: 59 | Admitting: Internal Medicine

## 2020-08-25 VITALS — BP 136/88 | HR 97 | Temp 98.1°F | Ht 72.8 in | Wt 243.0 lb

## 2020-08-25 DIAGNOSIS — Z6832 Body mass index (BMI) 32.0-32.9, adult: Secondary | ICD-10-CM

## 2020-08-25 DIAGNOSIS — E6609 Other obesity due to excess calories: Secondary | ICD-10-CM

## 2020-08-25 DIAGNOSIS — Z0000 Encounter for general adult medical examination without abnormal findings: Secondary | ICD-10-CM

## 2020-08-25 DIAGNOSIS — M25512 Pain in left shoulder: Secondary | ICD-10-CM

## 2020-08-25 DIAGNOSIS — N183 Chronic kidney disease, stage 3 unspecified: Secondary | ICD-10-CM | POA: Diagnosis not present

## 2020-08-25 DIAGNOSIS — H6122 Impacted cerumen, left ear: Secondary | ICD-10-CM | POA: Diagnosis not present

## 2020-08-25 DIAGNOSIS — I129 Hypertensive chronic kidney disease with stage 1 through stage 4 chronic kidney disease, or unspecified chronic kidney disease: Secondary | ICD-10-CM

## 2020-08-25 LAB — POCT UA - MICROALBUMIN
Creatinine, POC: 300 mg/dL
Microalbumin Ur, POC: 150 mg/L

## 2020-08-25 LAB — POCT URINALYSIS DIPSTICK
Bilirubin, UA: NEGATIVE
Glucose, UA: NEGATIVE
Ketones, UA: NEGATIVE
Leukocytes, UA: NEGATIVE
Nitrite, UA: NEGATIVE
Protein, UA: POSITIVE — AB
Spec Grav, UA: >=1.03 — AB (ref 1.010–1.025)
Urobilinogen, UA: 0.2 E.U./dL
pH, UA: 6 (ref 5.0–8.0)

## 2020-08-25 NOTE — Patient Instructions (Signed)

## 2020-08-25 NOTE — Progress Notes (Signed)
I,Tianna Badgett,acting as a Education administrator for Maximino Greenland, MD.,have documented all relevant documentation on the behalf of Maximino Greenland, MD,as directed by  Maximino Greenland, MD while in the presence of Maximino Greenland, MD.  This visit occurred during the SARS-CoV-2 public health emergency.  Safety protocols were in place, including screening questions prior to the visit, additional usage of staff PPE, and extensive cleaning of exam room while observing appropriate contact time as indicated for disinfecting solutions.  Subjective:     Patient ID: Dale Odom , male    DOB: [birth year] , 49 y.o.   MRN: [id number]   Chief Complaint  Patient presents with  . Annual Exam  . Hypertension    HPI  He is here today for physical exam. He reports compliance with medications. He has no specific concerns or complaints at this time.   Hypertension This is a chronic problem. The current episode started more than 1 year ago. The problem has been gradually improving since onset. The problem is uncontrolled. Pertinent negatives include no blurred vision or chest pain. Past treatments include angiotensin blockers and calcium channel blockers. The current treatment provides moderate improvement. Compliance problems include exercise.  Hypertensive end-organ damage includes kidney disease.     Past Medical History:  Diagnosis Date  . Cancer ([place])    right kidney  . Chronic kidney disease    CKD stage 3  . Hypertension   . Pre-diabetes    hx of no longer     Family History  Problem Relation Age of Onset  . Hypertension Mother   . Hyperlipidemia Mother   . Heart Problems Father   . Prostate cancer Father   . Dementia Father      Current Outpatient Medications:  .  acetaminophen (TYLENOL) 500 MG tablet, Take 1,000 mg by mouth every 6 (six) hours as needed for mild pain or headache., Disp: , Rfl:  .  amLODipine (NORVASC) 10 MG tablet, Take 1 tablet (10 mg total) by mouth at bedtime., Disp: 90  tablet, Rfl: 2 .  Chlorpheniramine Maleate (ALLERGY PO), Take by mouth., Disp: , Rfl:  .  olmesartan (BENICAR) 40 MG tablet, Take 1 tablet (40 mg total) by mouth daily., Disp: 90 tablet, Rfl: 2 .  oxybutynin (DITROPAN-XL) 10 MG 24 hr tablet, Take 10 mg by mouth daily., Disp: , Rfl:  .  rosuvastatin (CRESTOR) 20 MG tablet, TAKE 1 TABLET BY MOUTH ONCE DAILY, Disp: 30 tablet, Rfl: 1 .  sildenafil (REVATIO) 20 MG tablet, SMARTSIG:5 Tablet(s) By Mouth As Directed, Disp: , Rfl:  .  valACYclovir (VALTREX) 500 MG tablet, TAKE 1 TABLET BY MOUTH TWICE DAILY, Disp: 180 tablet, Rfl: 2   No Known Allergies   Men's preventive visit. Patient Health Questionnaire (PHQ-2) is  [place] Office Visit from 08/25/2020 in [facility]  PHQ-2 Total Score 0    . Patient is on a healthy diet. Marital status: Married. Relevant history for alcohol use is:  Social History   Substance and Sexual Activity  Alcohol Use No  . Relevant history for tobacco use is:  Social History   Tobacco Use  Smoking Status Former Smoker  . Packs/day: 0.25  . Years: 29.00  . Pack years: 7.25  . Types: Cigarettes  . Quit date: 06/05/2019  . Years since quitting: 1.2  Smokeless Tobacco Never Used  Tobacco Comment   2 a day  .   Review of Systems  Constitutional: Negative.   HENT:  Negative.   Eyes: Negative.  Negative for blurred vision.  Respiratory: Negative.   Cardiovascular: Negative.  Negative for chest pain.  Gastrointestinal: Negative.   Endocrine: Negative.   Genitourinary: Negative.   Musculoskeletal: Positive for arthralgias.       He c/o left shoulder pain. Sx started about 2-3 months ago. He reports pain has progressed overtime. It is a dull, aching pain - intermittent. It has increased in frequency recently. It is aggravated by working out.   Skin: Negative.   Allergic/Immunologic: Negative.   Neurological: Negative.   Hematological: Negative.   Psychiatric/Behavioral: Negative.       Today's Vitals   08/25/20 0911  BP: 136/88  Pulse: 97  Temp: 98.1 F (36.7 C)  TempSrc: Oral  Weight: 243 lb (110.2 kg)  Height: 6' 0.8" (1.849 m)   Body mass index is 32.24 kg/m.  Wt Readings from Last 3 Encounters:  08/25/20 243 lb (110.2 kg)  02/11/20 239 lb 12.8 oz (108.8 kg)  09/17/19 239 lb 9.6 oz (108.7 kg)     Objective:  Physical Exam Vitals and nursing note reviewed.  Constitutional:      Appearance: Normal appearance. He is obese.  HENT:     Head: Normocephalic and atraumatic.     Right Ear: Tympanic membrane, ear canal and external ear normal.     Left Ear: Ear canal and external ear normal. There is impacted cerumen.     Nose:     Comments: Deferred, masked    Mouth/Throat:     Comments: Deferred, masked Eyes:     Extraocular Movements: Extraocular movements intact.     Conjunctiva/sclera: Conjunctivae normal.     Pupils: Pupils are equal, round, and reactive to light.  Cardiovascular:     Rate and Rhythm: Normal rate and regular rhythm.     Pulses: Normal pulses.     Heart sounds: Normal heart sounds.  Pulmonary:     Effort: Pulmonary effort is normal.     Breath sounds: Normal breath sounds.  Chest:  Breasts:     Right: Normal. No swelling, bleeding, inverted nipple, mass or nipple discharge.     Left: Normal. No swelling, bleeding, inverted nipple, mass or nipple discharge.    Abdominal:     General: Bowel sounds are normal.     Palpations: Abdomen is soft.  Genitourinary:    Comments: Deferred Musculoskeletal:        General: Normal range of motion.     Left shoulder: Tenderness present. Normal range of motion.     Cervical back: Normal range of motion and neck supple.  Skin:    General: Skin is warm.  Neurological:     General: No focal deficit present.     Mental Status: He is alert.  Psychiatric:        Mood and Affect: Mood normal.        Behavior: Behavior normal.         Assessment And Plan:    1. Routine general  medical examination at health care facility Comments: A full exam was performed. DRE deferred, he is followed by Urology. He is not sure if they performed PSA, will check today.  I will also request most recent Urology note.  PATIENT IS ADVISED TO GET 30-45 MINUTES REGULAR EXERCISE NO LESS THAN FOUR TO FIVE DAYS PER WEEK - BOTH WEIGHTBEARING EXERCISES AND AEROBIC ARE RECOMMENDED.  PATIENT IS ADVISED TO FOLLOW A HEALTHY DIET WITH AT LEAST SIX FRUITS/VEGGIES PER DAY,  DECREASE INTAKE OF RED MEAT, AND TO INCREASE FISH INTAKE TO TWO DAYS PER WEEK.  MEATS/FISH SHOULD NOT BE FRIED, BAKED OR BROILED IS PREFERABLE.  I SUGGEST WEARING SPF 50 SUNSCREEN ON EXPOSED PARTS AND ESPECIALLY WHEN IN THE DIRECT SUNLIGHT FOR AN EXTENDED PERIOD OF TIME.  PLEASE AVOID FAST FOOD RESTAURANTS AND INCREASE YOUR WATER INTAKE.  - CBC - Hemoglobin A1c - CMP14+EGFR - Lipid panel - Hepatitis C antibody - PSA  2. Hypertensive nephropathy Comments: Chronic, fair control. Pt is aware that optimal BP is less than 130/80. He is encouraged to follow low sodium diet. EKG performed, NSr w/ nonspecific T abnormality, no new changes. He will rto in six months for re-evaluation.  - POCT Urinalysis Dipstick (81002) - POCT UA - Microalbumin - EKG 12-Lead  3. Stage 3 chronic kidney disease, unspecified whether stage 3a or 3b CKD ([place]) Comments: Chronic, also followed by Renal. I will request most recent office notes. He is encouraged to achieve optimal BP control and stay well hydrated in order to prevent progression of CKD.   4. Acute pain of left shoulder Comments: He agrees to Ortho referral for further evaluation and radiographic studies.  - Ambulatory referral to Orthopedic Surgery  5. Left ear impacted cerumen  AFTER OBTAINING VERBAL CONSENT, LEFT EAR WAS FLUSHED BY IRRIGATION. HE TOLERATED PROCEDURE WELL WITHOUT ANY COMPLICATIONS. NO TM ABNORMALITIES WERE NOTED. - Ear Lavage  6. Class 1 obesity due to excess calories with  serious comorbidity and body mass index (BMI) of 32.0 to 32.9 in adult Comments: He is aware of 4 lb. weight gain. He is encouraged to resume his regular exercise regimen. Advised to aim for 150 minutes of exercise per week.  Patient was given opportunity to ask questions. Patient verbalized understanding of the plan and was able to repeat key elements of the plan. All questions were answered to their satisfaction.  Maximino Greenland, MD   I, Maximino Greenland, MD, have reviewed all documentation for this visit. The documentation on 08/25/20 for the exam, diagnosis, procedures, and orders are all accurate and complete.  THE PATIENT IS ENCOURAGED TO PRACTICE SOCIAL DISTANCING DUE TO THE COVID-19 PANDEMIC.

## 2020-08-26 LAB — CMP14+EGFR
ALT: 31 IU/L (ref 0–44)
AST: 30 IU/L (ref 0–40)
Albumin/Globulin Ratio: 1.8 (ref 1.2–2.2)
Albumin: 4.4 g/dL (ref 4.0–5.0)
Alkaline Phosphatase: 99 IU/L (ref 44–121)
BUN/Creatinine Ratio: 7 — ABNORMAL LOW (ref 9–20)
BUN: 12 mg/dL (ref 6–24)
Bilirubin Total: 0.3 mg/dL (ref 0.0–1.2)
CO2: 20 mmol/L (ref 20–29)
Calcium: 9.5 mg/dL (ref 8.7–10.2)
Chloride: 102 mmol/L (ref 96–106)
Creatinine, Ser: 1.71 mg/dL — ABNORMAL HIGH (ref 0.76–1.27)
GFR calc Af Amer: 53 mL/min/{1.73_m2} — ABNORMAL LOW (ref >59)
GFR calc non Af Amer: 46 mL/min/{1.73_m2} — ABNORMAL LOW (ref >59)
Globulin, Total: 2.5 g/dL (ref 1.5–4.5)
Glucose: 96 mg/dL (ref 65–99)
Potassium: 4.5 mmol/L (ref 3.5–5.2)
Sodium: 138 mmol/L (ref 134–144)
Total Protein: 6.9 g/dL (ref 6.0–8.5)

## 2020-08-26 LAB — CBC
Hematocrit: 42.8 % (ref 37.5–51.0)
Hemoglobin: 14.7 g/dL (ref 13.0–17.7)
MCH: 28.4 pg (ref 26.6–33.0)
MCHC: 34.3 g/dL (ref 31.5–35.7)
MCV: 83 fL (ref 79–97)
Platelets: 269 10*3/uL (ref 150–450)
RBC: 5.18 x10E6/uL (ref 4.14–5.80)
RDW: 14.6 % (ref 11.6–15.4)
WBC: 6.4 10*3/uL (ref 3.4–10.8)

## 2020-08-26 LAB — HEMOGLOBIN A1C
Est. average glucose Bld gHb Est-mCnc: 126 mg/dL
Hgb A1c MFr Bld: 6 % — ABNORMAL HIGH (ref 4.8–5.6)

## 2020-08-26 LAB — PSA: Prostate Specific Ag, Serum: 1.5 ng/mL (ref 0.0–4.0)

## 2020-08-26 LAB — LIPID PANEL
Chol/HDL Ratio: 5.4 ratio — ABNORMAL HIGH (ref 0.0–5.0)
Cholesterol, Total: 212 mg/dL — ABNORMAL HIGH (ref 100–199)
HDL: 39 mg/dL — ABNORMAL LOW (ref >39)
LDL Chol Calc (NIH): 137 mg/dL — ABNORMAL HIGH (ref 0–99)
Triglycerides: 201 mg/dL — ABNORMAL HIGH (ref 0–149)
VLDL Cholesterol Cal: 36 mg/dL (ref 5–40)

## 2020-08-26 LAB — HEPATITIS C ANTIBODY: Hep C Virus Ab: <0.1 s/co ratio (ref 0.0–0.9)

## 2020-09-08 ENCOUNTER — Ambulatory Visit: Payer: 59 | Admitting: Orthopedic Surgery

## 2020-09-10 MED FILL — SILDENAFIL CITRATE 20 MG TA: 20 | 18 days supply | Qty: 90 | Fill #4

## 2020-09-10 MED FILL — ROSUVASTATIN CALCIUM 20 MG: 20 | 30 days supply | Qty: 30 | Fill #1

## 2020-09-11 ENCOUNTER — Encounter: Payer: Self-pay | Admitting: Orthopedic Surgery

## 2020-09-11 ENCOUNTER — Other Ambulatory Visit: Payer: Self-pay

## 2020-09-11 ENCOUNTER — Ambulatory Visit: Payer: 59 | Admitting: Physician Assistant

## 2020-09-11 ENCOUNTER — Ambulatory Visit (INDEPENDENT_AMBULATORY_CARE_PROVIDER_SITE_OTHER): Payer: 59

## 2020-09-11 DIAGNOSIS — M25562 Pain in left knee: Secondary | ICD-10-CM | POA: Diagnosis not present

## 2020-09-11 DIAGNOSIS — M25512 Pain in left shoulder: Secondary | ICD-10-CM | POA: Diagnosis not present

## 2020-09-11 DIAGNOSIS — G8929 Other chronic pain: Secondary | ICD-10-CM

## 2020-09-11 MED ORDER — LIDOCAINE HCL 1 % IJ SOLN
5.0000 mL | INTRAMUSCULAR | Status: AC | PRN
  Administered 2020-09-11: 5 mL

## 2020-09-11 MED ORDER — METHYLPREDNISOLONE ACETATE 40 MG/ML IJ SUSP
40.0000 mg | INTRAMUSCULAR | Status: AC | PRN
  Administered 2020-09-11: 40 mg via INTRA_ARTICULAR

## 2020-09-11 NOTE — Progress Notes (Signed)
Office Visit Note   Patient: Dale Odom           Date of Birth: 12/11/1970           MRN: [id number] Visit Date: 09/11/2020              Requested by: Dorothyann Peng, MD [address] [place],  [place] [address] PCP: Dorothyann Peng, MD  Chief Complaint  Patient presents with  . Left Knee - Pain  . Left Shoulder - Pain      HPI: Patient is a pleasant 50 year old gentleman with a chief complaint of left shoulder and left knee pain.  He denies any injuries.  He normally works out at [employer] and has not done this in a while.  He would like to get back to working out at the gym.  In his shoulder he describes aching in the shoulder joint especially with activities.  In the left knee he says pain is on the medial side of the kneecap.  While this does not bother him when he is sitting hip bothers him when he is weightbearing.  He denies any effusion.  He is status post rotator cuff repair on the right shoulder he has tried Tylenol and some topical anti-inflammatory gel  Assessment & Plan: Visit Diagnoses:  1. Chronic left shoulder pain   2. Chronic pain of left knee     Plan: Discussed closed chain exercises to strengthen his quad muscle in his knees.  Discussed that deep squatting in the gym may aggravate his anterior knee symptoms.  Would also recommend overall conditioning.  Patient would like to go forward with injections today.  He will continue to use topical anti-inflammatories.  Follow-up in 3 weeks.  Follow-Up Instructions: No follow-ups on file.   Ortho Exam  Patient is alert, oriented, no adenopathy, well-dressed, normal affect, normal respiratory effort. Left shoulder.  No effusion no palpable abnormalities.  He has good inversion behind his back.  He has some pain with resisted abduction.  Pain with external rotation.  And positive impingement findings.  Left knee no effusion no swelling.  No grinding.  He does have some tenderness along the medial border  of the patella and in the medial joint line.  Good endpoint on Lachman testing no cellulitis  Imaging: No results found. No images are attached to the encounter.  Labs: Lab Results  Component Value Date   HGBA1C 6.0 (H) 08/25/2020   HGBA1C 5.6 08/06/2019   HGBA1C 5.8 (H) 05/28/2019     Lab Results  Component Value Date   ALBUMIN 4.4 08/25/2020   ALBUMIN 3.9 (L) 02/11/2020   ALBUMIN 4.1 08/06/2019    No results found for: MG No results found for: VD25OH  No results found for: PREALBUMIN CBC EXTENDED Latest Ref Rng & Units 08/25/2020 08/06/2019 06/08/2019  WBC 3.4 - 10.8 x10E3/uL 6.4 6.0 -  RBC 4.14 - 5.80 x10E6/uL 5.18 4.93 -  HGB 13.0 - 17.7 g/dL 19.1 47.8 29.5  HCT 62.1 - 51.0 % 42.8 42.1 42.3  PLT 150 - 450 x10E3/uL 269 262 -  NEUTROABS - - - -  LYMPHSABS - - - -     There is no height or weight on file to calculate BMI.  Orders:  Orders Placed This Encounter  Procedures  . XR Shoulder Left  . XR Knee 1-2 Views Left   No orders of the defined types were placed in this encounter.    Procedures: Large Joint  Inj on 09/11/2020 9:17 AM Indications: pain and diagnostic evaluation Details: 22 G 1.5 in needle, anteromedial approach  Arthrogram: No  Medications: 40 mg methylPREDNISolone acetate 40 MG/ML; 5 mL lidocaine 1 % Outcome: tolerated well, no immediate complications Procedure, treatment alternatives, risks and benefits explained, specific risks discussed. Consent was given by the patient.      Clinical Data: No additional findings.  ROS:  All other systems negative, except as noted in the HPI. Review of Systems  Objective: Vital Signs: There were no vitals taken for this visit.  Specialty Comments:  No specialty comments available.  PMFS History: Patient Active Problem List   Diagnosis Date Noted  . Neoplasm of right kidney 06/07/2019  . Hypertensive nephropathy 12/25/2018  . Chronic renal disease, stage III ([place]) 12/25/2018  . Muscle  cramps 12/25/2018   Past Medical History:  Diagnosis Date  . Cancer ([place])    right kidney  . Chronic kidney disease    CKD stage 3  . Hypertension   . Pre-diabetes    hx of no longer    Family History  Problem Relation Age of Onset  . Hypertension Mother   . Hyperlipidemia Mother   . Heart Problems Father   . Prostate cancer Father   . Dementia Father     Past Surgical History:  Procedure Laterality Date  . HERNIA REPAIR    . ROBOTIC ASSITED PARTIAL NEPHRECTOMY Right 06/07/2019   Procedure: XI ROBOTIC ASSITED PARTIAL NEPHRECTOMY;  Surgeon: Raynelle Bring, MD;  Location: [facility];  Service: Urology;  Laterality: Right;  . ROTATOR CUFF REPAIR     right  . VASECTOMY     Social History   Occupational History  . Not on file  Tobacco Use  . Smoking status: Former Smoker    Packs/day: 0.25    Years: 29.00    Pack years: 7.25    Types: Cigarettes    Quit date: 06/05/2019    Years since quitting: 1.2  . Smokeless tobacco: Never Used  . Tobacco comment: 2 a day  Vaping Use  . Vaping Use: Never used  Substance and Sexual Activity  . Alcohol use: No  . Drug use: Yes    Types: Marijuana  . Sexual activity: Yes    Birth control/protection: Surgical

## 2020-10-08 ENCOUNTER — Ambulatory Visit: Payer: 59 | Admitting: Physician Assistant

## 2020-10-22 ENCOUNTER — Other Ambulatory Visit: Payer: Self-pay | Admitting: Internal Medicine

## 2020-10-22 MED FILL — VALACYCLOVIR HCL 500 MG TAB: 500 | 90 days supply | Qty: 180 | Fill #1

## 2020-10-22 MED FILL — SILDENAFIL CITRATE 20 MG TA: 20 | 18 days supply | Qty: 90 | Fill #5

## 2020-10-22 MED FILL — ROSUVASTATIN CALCIUM 20 MG: 20 | 30 days supply | Qty: 30 | Fill #0

## 2020-10-29 MED FILL — OXYBUTYNIN CL ER 10 MG TAB: 10 | 90 days supply | Qty: 90 | Fill #2

## 2020-11-10 DIAGNOSIS — E559 Vitamin D deficiency, unspecified: Secondary | ICD-10-CM | POA: Diagnosis not present

## 2020-11-10 DIAGNOSIS — E1122 Type 2 diabetes mellitus with diabetic chronic kidney disease: Secondary | ICD-10-CM | POA: Diagnosis not present

## 2020-11-10 DIAGNOSIS — I129 Hypertensive chronic kidney disease with stage 1 through stage 4 chronic kidney disease, or unspecified chronic kidney disease: Secondary | ICD-10-CM | POA: Diagnosis not present

## 2020-11-10 DIAGNOSIS — E669 Obesity, unspecified: Secondary | ICD-10-CM | POA: Diagnosis not present

## 2020-11-10 DIAGNOSIS — N2581 Secondary hyperparathyroidism of renal origin: Secondary | ICD-10-CM | POA: Diagnosis not present

## 2020-11-10 DIAGNOSIS — D631 Anemia in chronic kidney disease: Secondary | ICD-10-CM | POA: Diagnosis not present

## 2020-11-10 DIAGNOSIS — E7800 Pure hypercholesterolemia, unspecified: Secondary | ICD-10-CM | POA: Diagnosis not present

## 2020-11-10 DIAGNOSIS — N183 Chronic kidney disease, stage 3 unspecified: Secondary | ICD-10-CM | POA: Diagnosis not present

## 2020-11-10 DIAGNOSIS — N189 Chronic kidney disease, unspecified: Secondary | ICD-10-CM | POA: Diagnosis not present

## 2020-11-10 MED FILL — AMLODIPINE BESYLATE 10 MG T: 10 | 90 days supply | Qty: 90 | Fill #2

## 2020-11-12 MED FILL — OLMESARTAN MEDOXOMIL 40 MG: 40 | 90 days supply | Qty: 90 | Fill #2

## 2020-11-15 MED FILL — ROSUVASTATIN CALCIUM 20 MG: 20 | 30 days supply | Qty: 30 | Fill #1

## 2020-11-26 ENCOUNTER — Other Ambulatory Visit (HOSPITAL_BASED_OUTPATIENT_CLINIC_OR_DEPARTMENT_OTHER): Payer: Self-pay

## 2020-12-23 ENCOUNTER — Other Ambulatory Visit (HOSPITAL_COMMUNITY): Payer: Self-pay

## 2020-12-23 ENCOUNTER — Other Ambulatory Visit: Payer: Self-pay | Admitting: Internal Medicine

## 2020-12-23 MED ORDER — LEVOCETIRIZINE DIHYDROCHLORIDE 5 MG PO TABS: 5.0000 mg | ORAL_TABLET | ORAL | 0 refills | Status: DC

## 2020-12-23 MED ORDER — BENZONATATE 200 MG PO CAPS
ORAL_CAPSULE | ORAL | 0 refills | Status: DC
  Filled 2020-12-23: qty 30, 10d supply, fill #0

## 2020-12-23 MED ORDER — IPRATROPIUM BROMIDE 0.06 % NA SOLN
NASAL | 0 refills | Status: DC
  Filled 2020-12-23: qty 15, 12d supply, fill #0

## 2020-12-23 MED FILL — Sildenafil Citrate Tab 20 MG: ORAL | 18 days supply | Qty: 90 | Fill #0 | Status: AC

## 2020-12-24 ENCOUNTER — Other Ambulatory Visit (HOSPITAL_COMMUNITY): Payer: Self-pay

## 2020-12-24 MED ORDER — ROSUVASTATIN CALCIUM 20 MG PO TABS
ORAL_TABLET | Freq: Every day | ORAL | 1 refills | Status: DC
  Filled 2020-12-24: qty 90, 90d supply, fill #0
  Filled 2021-03-24: qty 90, 90d supply, fill #1

## 2020-12-26 ENCOUNTER — Other Ambulatory Visit (HOSPITAL_COMMUNITY): Payer: Self-pay

## 2020-12-30 ENCOUNTER — Other Ambulatory Visit (HOSPITAL_COMMUNITY): Payer: Self-pay

## 2021-02-03 ENCOUNTER — Other Ambulatory Visit: Payer: Self-pay

## 2021-02-03 ENCOUNTER — Other Ambulatory Visit: Payer: Self-pay | Admitting: Internal Medicine

## 2021-02-03 ENCOUNTER — Other Ambulatory Visit (HOSPITAL_COMMUNITY): Payer: Self-pay

## 2021-02-03 MED ORDER — AMLODIPINE BESYLATE 10 MG PO TABS
ORAL_TABLET | Freq: Every day | ORAL | 2 refills | Status: DC
Start: 2021-02-03 — End: 2021-09-14
  Filled 2021-02-03: qty 90, 90d supply, fill #0
  Filled 2021-05-12: qty 90, 90d supply, fill #1
  Filled 2021-08-17: qty 90, 90d supply, fill #2

## 2021-02-03 MED ORDER — OLMESARTAN MEDOXOMIL 40 MG PO TABS
ORAL_TABLET | Freq: Every day | ORAL | 2 refills | Status: DC
  Filled 2021-02-03: qty 90, 90d supply, fill #0
  Filled 2021-05-12: qty 90, 90d supply, fill #1
  Filled 2021-08-17: qty 90, 90d supply, fill #2

## 2021-02-03 MED FILL — Oxybutynin Chloride Tab ER 24HR 10 MG: ORAL | 90 days supply | Qty: 90 | Fill #0 | Status: AC

## 2021-02-04 ENCOUNTER — Other Ambulatory Visit (HOSPITAL_COMMUNITY): Payer: Self-pay

## 2021-02-04 MED ORDER — SILDENAFIL CITRATE 20 MG PO TABS
ORAL_TABLET | ORAL | 11 refills | Status: DC
  Filled 2021-02-04: qty 90, 18d supply, fill #0
  Filled 2021-03-24: qty 90, 18d supply, fill #1
  Filled 2021-05-12: qty 90, 18d supply, fill #2
  Filled 2021-08-17: qty 90, 18d supply, fill #3
  Filled 2021-11-12: qty 90, 18d supply, fill #4
  Filled 2021-12-25: qty 90, 18d supply, fill #5

## 2021-02-23 ENCOUNTER — Other Ambulatory Visit: Payer: Self-pay

## 2021-02-23 ENCOUNTER — Ambulatory Visit: Payer: 59 | Admitting: Internal Medicine

## 2021-02-23 ENCOUNTER — Encounter: Payer: Self-pay | Admitting: Internal Medicine

## 2021-02-23 VITALS — BP 136/84 | HR 64 | Temp 97.8°F | Ht 72.8 in | Wt 242.2 lb

## 2021-02-23 DIAGNOSIS — Z6832 Body mass index (BMI) 32.0-32.9, adult: Secondary | ICD-10-CM

## 2021-02-23 DIAGNOSIS — E7800 Pure hypercholesterolemia, unspecified: Secondary | ICD-10-CM

## 2021-02-23 DIAGNOSIS — C641 Malignant neoplasm of right kidney, except renal pelvis: Secondary | ICD-10-CM | POA: Diagnosis not present

## 2021-02-23 DIAGNOSIS — E6609 Other obesity due to excess calories: Secondary | ICD-10-CM | POA: Diagnosis not present

## 2021-02-23 DIAGNOSIS — M25562 Pain in left knee: Secondary | ICD-10-CM | POA: Diagnosis not present

## 2021-02-23 DIAGNOSIS — G8929 Other chronic pain: Secondary | ICD-10-CM | POA: Diagnosis not present

## 2021-02-23 DIAGNOSIS — I129 Hypertensive chronic kidney disease with stage 1 through stage 4 chronic kidney disease, or unspecified chronic kidney disease: Secondary | ICD-10-CM

## 2021-02-23 DIAGNOSIS — Z23 Encounter for immunization: Secondary | ICD-10-CM

## 2021-02-23 DIAGNOSIS — R7309 Other abnormal glucose: Secondary | ICD-10-CM | POA: Diagnosis not present

## 2021-02-23 DIAGNOSIS — N1831 Chronic kidney disease, stage 3a: Secondary | ICD-10-CM

## 2021-02-23 DIAGNOSIS — K5730 Diverticulosis of large intestine without perforation or abscess without bleeding: Secondary | ICD-10-CM | POA: Diagnosis not present

## 2021-02-23 DIAGNOSIS — Z9889 Other specified postprocedural states: Secondary | ICD-10-CM | POA: Diagnosis not present

## 2021-02-23 MED ORDER — SHINGRIX 50 MCG/0.5ML IM SUSR: 0.5000 mL | Freq: Once | INTRAMUSCULAR | 0 refills | Status: AC

## 2021-02-23 NOTE — Patient Instructions (Addendum)
Cooking With Less Salt Cooking with less salt is one way to reduce the amount of sodium you get from food. Sodium is one of the elements that make up salt. It is found naturally in foods and is also added to certain foods. Depending on your condition and overall health, your health care provider or dietitian may recommend that you reduce your sodium intake. Most people should have less than 2,300 milligrams (mg) of sodium each day. If you have high blood pressure (hypertension), you may need to limit your sodium to 1,500 mg each day. Follow the tipsbelow to help reduce your sodium intake. What are tips for eating less sodium? Reading food labels  Check the food label before buying or using packaged ingredients. Always check the label for the serving size and sodium content. Look for products with no more than 140 mg of sodium in one serving. Check the % Daily Value column to see what percent of the daily recommended amount of sodium is provided in one serving of the product. Foods with 5% or less in this column are considered low in sodium. Foods with 20% or higher are considered high in sodium. Do not choose foods with salt as one of the first three ingredients on the ingredients list. If salt is one of the first three ingredients, it usually means the item is high in sodium.  Shopping Buy sodium-free or low-sodium products. Look for the following words on food labels: Low-sodium. Sodium-free. Reduced-sodium. No salt added. Unsalted. Always check the sodium content even if foods are labeled as low-sodium or no salt added. Buy fresh foods. Cooking Use herbs, seasonings without salt, and spices as substitutes for salt. Use sodium-free baking soda when baking. Grill, braise, or roast foods to add flavor with less salt. Avoid adding salt to pasta, rice, or hot cereals. Drain and rinse canned vegetables, beans, and meat before use. Avoid adding salt when cooking sweets and desserts. Cook with  low-sodium ingredients. What foods are high in sodium? Vegetables Regular canned vegetables (not low-sodium or reduced-sodium). Sauerkraut, pickled vegetables, and relishes. Olives. French fries. Onion rings. Regular canned tomato sauce and paste. Regular tomato and vegetable juice. Frozenvegetables in sauces. Grains Instant hot cereals. Bread stuffing, pancake, and biscuit mixes. Croutons. Seasoned rice or pasta mixes. Noodle soup cups. Boxed or frozen macaroni and cheese. Regular salted crackers. Self-rising flour. Rolls. Bagels. Flourtortillas and wraps. Meats and other proteins Meat or fish that is salted, canned, smoked, cured, spiced, or pickled. This includes bacon, ham, sausages, hot dogs, corned beef, chipped beef, meat loaves, salt pork, jerky, pickled herring, anchovies, regular canned tuna, andsardines. Salted nuts. Dairy Processed cheese and cheese spreads. Cheese curds. Blue cheese. Feta cheese.String cheese. Regular cottage cheese. Buttermilk. Canned milk. The items listed above may not be a complete list of foods high in sodium. Actual amounts of sodium may be different depending on processing. Contact a dietitian for more information. What foods are low in sodium? Fruits Fresh, frozen, or canned fruit with no sauce added. Fruit juice. Vegetables Fresh or frozen vegetables with no sauce added. "No salt added" canned vegetables. "No salt added" tomato sauce and paste. Low-sodium orreduced-sodium tomato and vegetable juice. Grains Noodles, pasta, quinoa, rice. Shredded or puffed wheat or puffed rice. Regular or quick oats (not instant). Low-sodium crackers. Low-sodium bread. Whole-grainbread and whole-grain pasta. Unsalted popcorn. Meats and other proteins Fresh or frozen whole meats, poultry (not injected with sodium), and fish with no sauce added. Unsalted nuts. Dried peas, beans, and   lentils without added salt. Unsalted canned beans. Eggs. Unsalted nut butters. Low-sodium canned  tunaor chicken. Dairy Milk. Soy milk. Yogurt. Low-sodium cheeses, such as Swiss, Monterey Jack, mozzarella, and ricotta. Sherbet or ice cream (keep to  cup per serving).Cream cheese. Fats and oils Unsalted butter or margarine. Other foods Homemade pudding. Sodium-free baking soda and baking powder. Herbs and spices.Low-sodium seasoning mixes. Beverages Coffee and tea. Carbonated beverages. The items listed above may not be a complete list of foods low in sodium. Actual amounts of sodium may be different depending on processing. Contact a dietitian for more information. What are some salt alternatives when cooking? The following are herbs, seasonings, and spices that can be used instead of salt to flavor your food. Herbs should be fresh or dried. Do not choose packaged mixes. Next to the name of the herb, spice, or seasoning aresome examples of foods you can pair it with. Herbs [place] leaves - Soups, meat and vegetable dishes, and spaghetti sauce. Basil - Italian dishes, soups, pasta, and fish dishes. Cilantro - Meat, poultry, and vegetable dishes. Chili powder - Marinades and Mexican dishes. Chives - Salad dressings and potato dishes. Cumin - Mexican dishes, couscous, and meat dishes. Dill - Fish dishes, sauces, and salads. Fennel - Meat and vegetable dishes, breads, and cookies. Garlic (do not use garlic salt) - Italian dishes, meat dishes, salad dressings, and sauces. Marjoram - Soups, potato dishes, and meat dishes. Oregano - Pizza and spaghetti sauce. Parsley - Salads, soups, pasta, and meat dishes. Rosemary - Italian dishes, salad dressings, soups, and red meats. Saffron - Fish dishes, pasta, and some poultry dishes. Sage - Stuffings and sauces. Tarragon - Fish and poultry dishes. Thyme - Stuffing, meat, and fish dishes. Seasonings Lemon juice - Fish dishes, poultry dishes, vegetables, and salads. Vinegar - Salad dressings, vegetables, and fish dishes. Spices Cinnamon - Sweet  dishes, such as cakes, cookies, and puddings. Cloves - Gingerbread, puddings, and marinades for meats. Curry - Vegetable dishes, fish and poultry dishes, and stir-fry dishes. Ginger - Vegetable dishes, fish dishes, and stir-fry dishes. Nutmeg - Pasta, vegetables, poultry, fish dishes, and custard. Summary Cooking with less salt is one way to reduce the amount of sodium that you get from food. Buy sodium-free or low-sodium products. Check the food label before using or buying packaged ingredients. Use herbs, seasonings without salt, and spices as substitutes for salt in foods. This information is not intended to replace advice given to you by your health care provider. Make sure you discuss any questions you have with your healthcare provider. Document Revised: 08/15/2019 Document Reviewed: 08/15/2019 Elsevier Patient Education  2022 Elsevier Inc.  

## 2021-02-23 NOTE — Progress Notes (Signed)
I,[name],acting as a Education administrator for Maximino Greenland, MD.,have documented all relevant documentation on the behalf of Maximino Greenland, MD,as directed by  Maximino Greenland, MD while in the presence of Maximino Greenland, MD.  This visit occurred during the SARS-CoV-2 public health emergency.  Safety protocols were in place, including screening questions prior to the visit, additional usage of staff PPE, and extensive cleaning of exam room while observing appropriate contact time as indicated for disinfecting solutions.  Subjective:     Patient ID: Dale Odom , male    DOB: [birth year] , 50 y.o.   MRN: [id number]   Chief Complaint  Patient presents with   Hypertension   Hyperlipidemia    HPI  He is here today for bp check and cholesterol follow-up.  He reports compliance with meds. He admits he has not been exercising regularly. He states he has cut back on exercise b/c chronic left knee pain. He states running and other cardio usually causes his knee pain to worsen.   Hypertension This is a chronic problem. The current episode started more than 1 year ago. The problem has been gradually improving since onset. The problem is uncontrolled. Pertinent negatives include no blurred vision or chest pain. Past treatments include angiotensin blockers and calcium channel blockers. The current treatment provides moderate improvement. Compliance problems include exercise.  Hypertensive end-organ damage includes kidney disease.  Hyperlipidemia Pertinent negatives include no chest pain.    Past Medical History:  Diagnosis Date   Cancer ([place])    right kidney   Chronic kidney disease    CKD stage 3   Hypertension    Pre-diabetes    hx of no longer     Family History  Problem Relation Age of Onset   Hypertension Mother    Hyperlipidemia Mother    Heart Problems Father    Prostate cancer Father    Dementia Father      Current Outpatient Medications:    acetaminophen (TYLENOL) 500 MG  tablet, Take 1,000 mg by mouth every 6 (six) hours as needed for mild pain or headache., Disp: , Rfl:    amLODipine (NORVASC) 10 MG tablet, TAKE 1 TABLET BY MOUTH ONCE DAILY AT BEDTIME, Disp: 90 tablet, Rfl: 2   Chlorpheniramine Maleate (ALLERGY PO), Take by mouth., Disp: , Rfl:    ipratropium (ATROVENT) 0.06 % nasal spray, Place 2 sprays into each nostril 3 times per day., Disp: 15 mL, Rfl: 0   olmesartan (BENICAR) 40 MG tablet, TAKE 1 TABLET BY MOUTH ONCE DAILY, Disp: 90 tablet, Rfl: 2   oxybutynin (DITROPAN-XL) 10 MG 24 hr tablet, TAKE 1 TABLET BY MOUTH ONCE A DAY, Disp: 90 tablet, Rfl: 11   rosuvastatin (CRESTOR) 20 MG tablet, TAKE 1 TABLET BY MOUTH ONCE DAILY, Disp: 90 tablet, Rfl: 1   sildenafil (REVATIO) 20 MG tablet, Take 5 tablets by mouth as directed, Disp: 90 tablet, Rfl: 11   valACYclovir (VALTREX) 500 MG tablet, Take 500 mg by mouth as needed., Disp: , Rfl:    Zoster Vaccine Adjuvanted (SHINGRIX) injection, Inject 0.5 mLs into the muscle once for 1 dose., Disp: 0.5 mL, Rfl: 0   benzonatate (TESSALON) 200 MG capsule, Take 1 capsule by mouth 2 to 3 times per day as needed for cough (Patient not taking: Reported on 02/23/2021), Disp: 30 capsule, Rfl: 0   levocetirizine (XYZAL) 5 MG tablet, Take 1 tablet (5 mg total) by mouth. (Patient not taking: Reported on 02/23/2021), Disp: 10 tablet, Rfl:  0   No Known Allergies   Review of Systems  Constitutional: Negative.   Eyes:  Negative for blurred vision.  Respiratory: Negative.    Cardiovascular: Negative.  Negative for chest pain.  Gastrointestinal: Negative.   Psychiatric/Behavioral: Negative.    All other systems reviewed and are negative.   Today's Vitals   02/23/21 1103  BP: 136/84  Pulse: 64  Temp: 97.8 F (36.6 C)  TempSrc: Oral  Weight: 242 lb 3.2 oz (109.9 kg)  Height: 6' 0.8" (1.849 m)   Body mass index is 32.13 kg/m.  Wt Readings from Last 3 Encounters:  02/23/21 242 lb 3.2 oz (109.9 kg)  08/25/20 243 lb (110.2 kg)   02/11/20 239 lb 12.8 oz (108.8 kg)    BP Readings from Last 3 Encounters:  02/23/21 136/84  08/25/20 136/88  02/11/20 132/80    Objective:  Physical Exam Vitals and nursing note reviewed.  Constitutional:      Appearance: Normal appearance.  HENT:     Head: Normocephalic and atraumatic.     Nose:     Comments: Masked     Mouth/Throat:     Comments: Masked  Cardiovascular:     Rate and Rhythm: Normal rate and regular rhythm.     Heart sounds: Normal heart sounds.  Pulmonary:     Effort: Pulmonary effort is normal.     Breath sounds: Normal breath sounds.  Musculoskeletal:     Cervical back: Normal range of motion.  Skin:    General: Skin is warm.  Neurological:     General: No focal deficit present.     Mental Status: He is alert.  Psychiatric:        Mood and Affect: Mood normal.        Assessment And Plan:     1. Hypertensive nephropathy Comments: Chronic, fair control. He is aware goal BP is less than 130/80. He is encouraged to incorporate more exercise into his daily routine -swimming or water aerobics are his best options due to arthralgias.  - CMP14+EGFR  2. Stage 3a chronic kidney disease (HCC) Comments: Chronic, also followed by Renal. He is aware that proper hydration and maintaining optimal BP control will help to decrease risk of progression of CKD.   3. Pure hypercholesterolemia Comments: Chronic, importance of statin compliance was d/w patient.  - Lipid panel  4. Chronic pain of left knee Comments: Possibly due to OA . Unfortunately, this has kept him from exercising. He is encouraged to use topical Voltaren gel and opt for water exercises like swimming.   5. Other abnormal glucose Comments: His a1c has been elevated in the past. I will recheck a1c today. Encouraged to limit intake of sweetened beverages, including diet drinks.  - Hemoglobin A1c  6. Class 1 obesity due to excess calories with serious comorbidity and body mass index (BMI) of 32.0  to 32.9 in adult Comments: He is encouraged to strive for BMI less than 30 to decrease cardiac risk. Advised to gradually increase daily activity, aiming for 150 minutes of exercise/week.  7. Immunization due Comments: I will send rx Shingrix to his local pharmacy.    Patient was given opportunity to ask questions. Patient verbalized understanding of the plan and was able to repeat key elements of the plan. All questions were answered to their satisfaction.   I, Maximino Greenland, MD, have reviewed all documentation for this visit. The documentation on 02/23/21 for the exam, diagnosis, procedures, and orders are all accurate and  complete.   IF YOU HAVE BEEN REFERRED TO A SPECIALIST, IT MAY TAKE 1-2 WEEKS TO SCHEDULE/PROCESS THE REFERRAL. IF YOU HAVE NOT HEARD FROM US/SPECIALIST IN TWO WEEKS, PLEASE GIVE [place] A CALL AT [phone].   THE PATIENT IS ENCOURAGED TO PRACTICE SOCIAL DISTANCING DUE TO THE COVID-19 PANDEMIC.

## 2021-02-24 LAB — CMP14+EGFR
ALT: 27 IU/L (ref 0–44)
AST: 19 IU/L (ref 0–40)
Albumin/Globulin Ratio: 1.7 (ref 1.2–2.2)
Albumin: 4.3 g/dL (ref 3.8–4.9)
Alkaline Phosphatase: 94 IU/L (ref 44–121)
BUN/Creatinine Ratio: 9 (ref 9–20)
BUN: 15 mg/dL (ref 6–24)
Bilirubin Total: 0.2 mg/dL (ref 0.0–1.2)
CO2: 20 mmol/L (ref 20–29)
Calcium: 9.4 mg/dL (ref 8.7–10.2)
Chloride: 106 mmol/L (ref 96–106)
Creatinine, Ser: 1.71 mg/dL — ABNORMAL HIGH (ref 0.76–1.27)
Globulin, Total: 2.5 g/dL (ref 1.5–4.5)
Glucose: 100 mg/dL — ABNORMAL HIGH (ref 65–99)
Potassium: 5.1 mmol/L (ref 3.5–5.2)
Sodium: 141 mmol/L (ref 134–144)
Total Protein: 6.8 g/dL (ref 6.0–8.5)
eGFR: 48 mL/min/{1.73_m2} — ABNORMAL LOW (ref >59)

## 2021-02-24 LAB — HEMOGLOBIN A1C
Est. average glucose Bld gHb Est-mCnc: 126 mg/dL
Hgb A1c MFr Bld: 6 % — ABNORMAL HIGH (ref 4.8–5.6)

## 2021-02-24 LAB — LIPID PANEL
Chol/HDL Ratio: 4.2 ratio (ref 0.0–5.0)
Cholesterol, Total: 195 mg/dL (ref 100–199)
HDL: 46 mg/dL (ref >39)
LDL Chol Calc (NIH): 129 mg/dL — ABNORMAL HIGH (ref 0–99)
Triglycerides: 111 mg/dL (ref 0–149)
VLDL Cholesterol Cal: 20 mg/dL (ref 5–40)

## 2021-02-27 DIAGNOSIS — Z85528 Personal history of other malignant neoplasm of kidney: Secondary | ICD-10-CM | POA: Diagnosis not present

## 2021-03-24 ENCOUNTER — Other Ambulatory Visit (HOSPITAL_COMMUNITY): Payer: Self-pay

## 2021-05-12 ENCOUNTER — Other Ambulatory Visit: Payer: Self-pay

## 2021-05-12 ENCOUNTER — Other Ambulatory Visit (HOSPITAL_COMMUNITY): Payer: Self-pay

## 2021-05-13 ENCOUNTER — Other Ambulatory Visit (HOSPITAL_COMMUNITY): Payer: Self-pay

## 2021-05-14 ENCOUNTER — Other Ambulatory Visit (HOSPITAL_COMMUNITY): Payer: Self-pay

## 2021-05-14 MED ORDER — OXYBUTYNIN CHLORIDE ER 10 MG PO TB24
ORAL_TABLET | Freq: Every day | ORAL | 11 refills | Status: DC
  Filled 2021-05-14: qty 90, 90d supply, fill #0
  Filled 2021-08-17: qty 90, 90d supply, fill #1
  Filled 2021-11-12: qty 90, 90d supply, fill #2
  Filled 2022-03-10: qty 90, 90d supply, fill #3

## 2021-05-25 ENCOUNTER — Other Ambulatory Visit (HOSPITAL_COMMUNITY): Payer: Self-pay

## 2021-06-26 IMAGING — DX DG CHEST 2V
2 series · 2 of 2 positions shown · non-contrast
Comparison: 09/03/2007

CLINICAL DATA: Right renal neoplasm

EXAM:
CHEST - 2 VIEW

[chest pa]
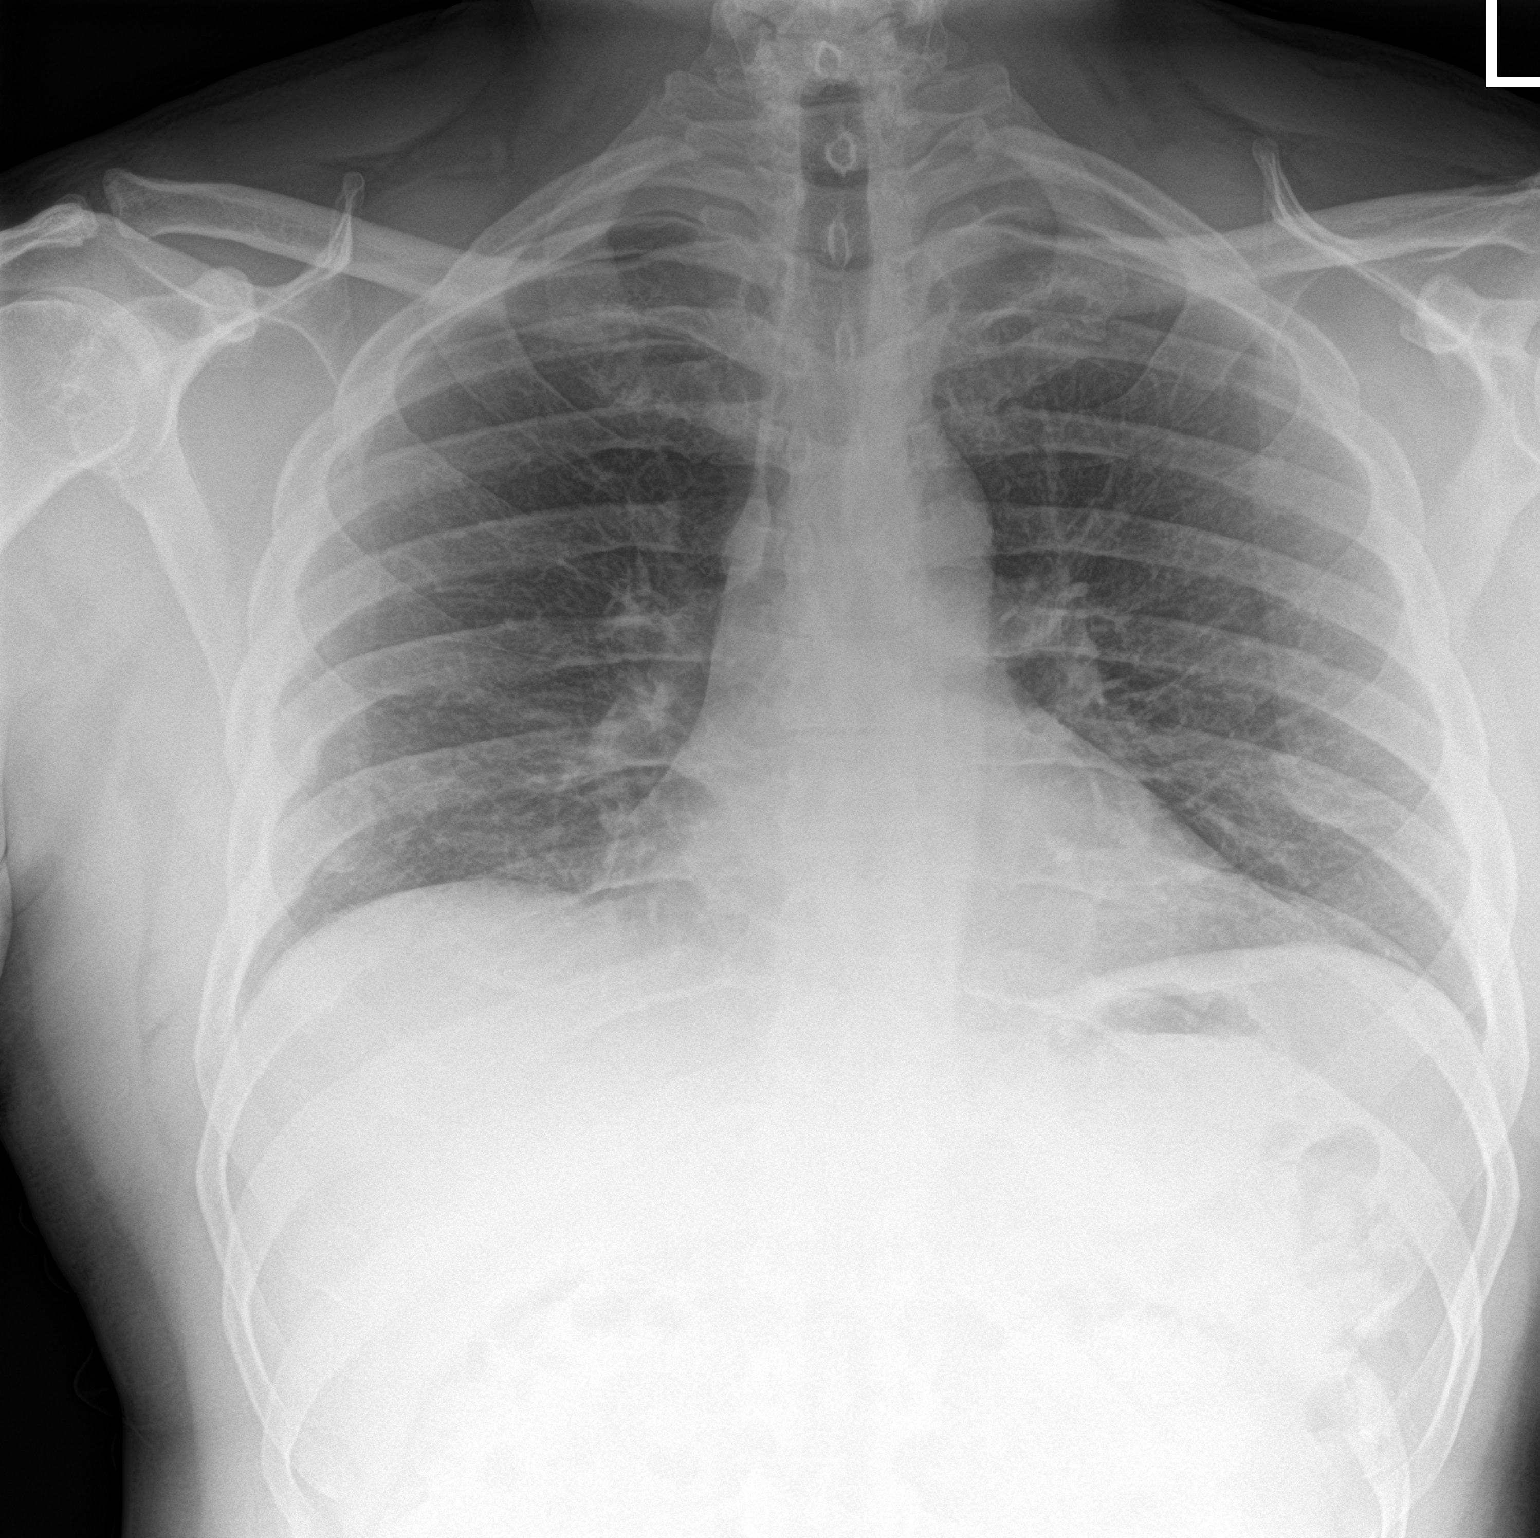

[chest lat]
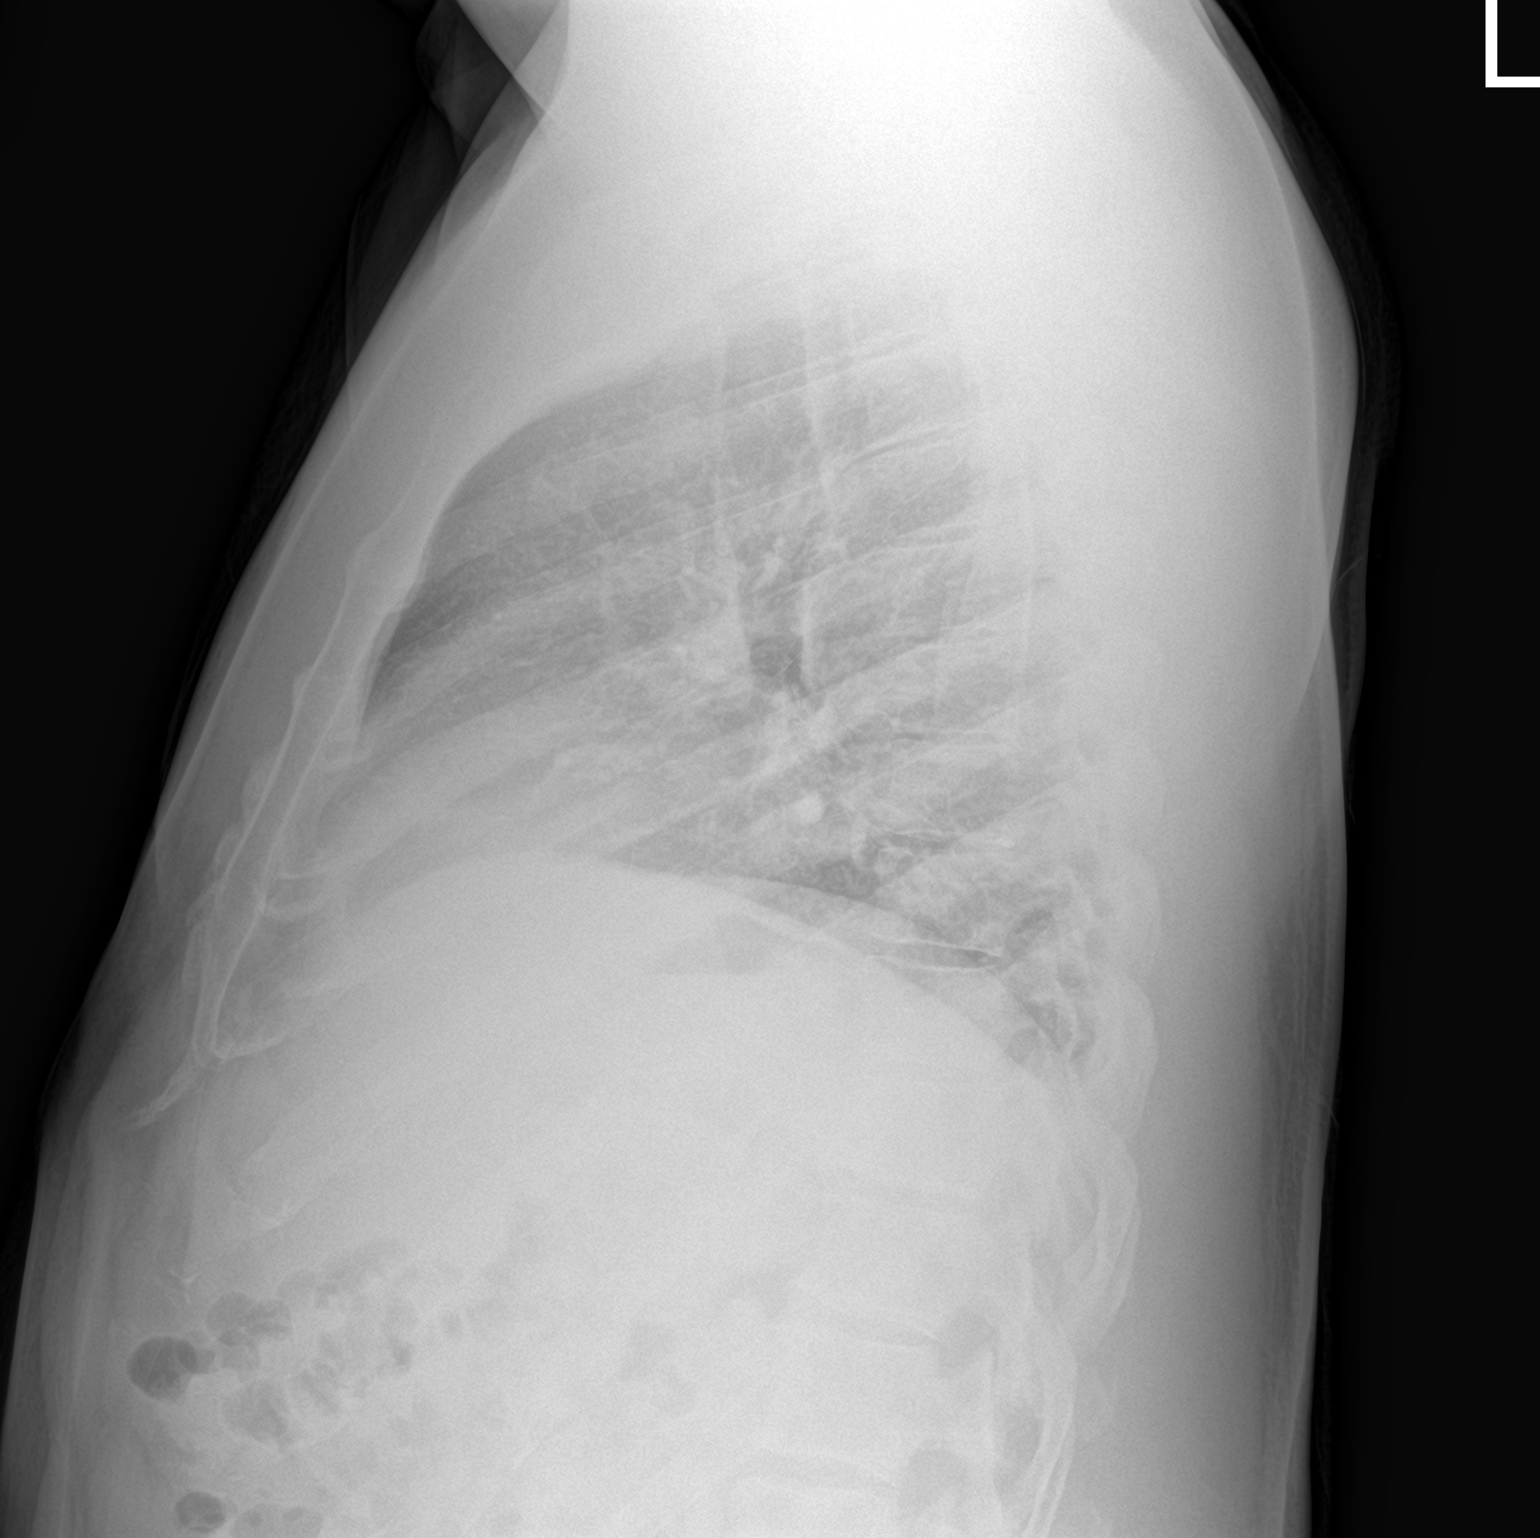

[2 of 2 positions shown; findings below may reference images not displayed]

FINDINGS: The heart size and mediastinal contours are within normal limits.
Both lungs are clear. There is a callused fracture deformity of the
right seventh rib.
IMPRESSION: No acute abnormality of the lungs. No obvious pulmonary metastatic
disease. Please note that CT is the test of choice for the detection
of pulmonary metastatic disease.

## 2021-07-06 ENCOUNTER — Other Ambulatory Visit (HOSPITAL_COMMUNITY): Payer: Self-pay

## 2021-07-06 ENCOUNTER — Other Ambulatory Visit: Payer: Self-pay | Admitting: Internal Medicine

## 2021-07-06 MED ORDER — ROSUVASTATIN CALCIUM 20 MG PO TABS
ORAL_TABLET | Freq: Every day | ORAL | 1 refills | Status: DC
Start: 2021-07-06 — End: 2021-09-14
  Filled 2021-07-06: qty 90, 90d supply, fill #0

## 2021-08-17 ENCOUNTER — Other Ambulatory Visit (HOSPITAL_COMMUNITY): Payer: Self-pay

## 2021-09-09 ENCOUNTER — Encounter: Payer: 59 | Admitting: Internal Medicine

## 2021-09-14 ENCOUNTER — Ambulatory Visit (INDEPENDENT_AMBULATORY_CARE_PROVIDER_SITE_OTHER): Payer: 59 | Admitting: Internal Medicine

## 2021-09-14 ENCOUNTER — Other Ambulatory Visit: Payer: Self-pay

## 2021-09-14 ENCOUNTER — Encounter: Payer: Self-pay | Admitting: Internal Medicine

## 2021-09-14 ENCOUNTER — Other Ambulatory Visit (HOSPITAL_COMMUNITY): Payer: Self-pay

## 2021-09-14 VITALS — BP 126/82 | HR 90 | Temp 98.6°F | Ht 72.8 in | Wt 241.6 lb

## 2021-09-14 DIAGNOSIS — E7800 Pure hypercholesterolemia, unspecified: Secondary | ICD-10-CM | POA: Insufficient documentation

## 2021-09-14 DIAGNOSIS — Z0000 Encounter for general adult medical examination without abnormal findings: Secondary | ICD-10-CM | POA: Diagnosis not present

## 2021-09-14 DIAGNOSIS — I129 Hypertensive chronic kidney disease with stage 1 through stage 4 chronic kidney disease, or unspecified chronic kidney disease: Secondary | ICD-10-CM | POA: Diagnosis not present

## 2021-09-14 DIAGNOSIS — N1831 Chronic kidney disease, stage 3a: Secondary | ICD-10-CM | POA: Diagnosis not present

## 2021-09-14 DIAGNOSIS — M2550 Pain in unspecified joint: Secondary | ICD-10-CM | POA: Diagnosis not present

## 2021-09-14 DIAGNOSIS — E559 Vitamin D deficiency, unspecified: Secondary | ICD-10-CM

## 2021-09-14 DIAGNOSIS — Z6832 Body mass index (BMI) 32.0-32.9, adult: Secondary | ICD-10-CM

## 2021-09-14 DIAGNOSIS — Z23 Encounter for immunization: Secondary | ICD-10-CM | POA: Diagnosis not present

## 2021-09-14 DIAGNOSIS — E6609 Other obesity due to excess calories: Secondary | ICD-10-CM | POA: Diagnosis not present

## 2021-09-14 DIAGNOSIS — C641 Malignant neoplasm of right kidney, except renal pelvis: Secondary | ICD-10-CM | POA: Insufficient documentation

## 2021-09-14 DIAGNOSIS — Z85528 Personal history of other malignant neoplasm of kidney: Secondary | ICD-10-CM | POA: Diagnosis not present

## 2021-09-14 LAB — POCT URINALYSIS DIPSTICK
Bilirubin, UA: NEGATIVE
Blood, UA: NEGATIVE
Glucose, UA: NEGATIVE
Ketones, UA: NEGATIVE
Leukocytes, UA: NEGATIVE
Nitrite, UA: NEGATIVE
Protein, UA: POSITIVE — AB
Spec Grav, UA: >=1.03 — AB (ref 1.010–1.025)
Urobilinogen, UA: 0.2 E.U./dL
pH, UA: 6 (ref 5.0–8.0)

## 2021-09-14 LAB — POCT UA - MICROALBUMIN
Creatinine, POC: 300 mg/dL
Microalbumin Ur, POC: 150 mg/L

## 2021-09-14 LAB — CMP14+EGFR
ALT: 22 IU/L (ref 0–44)
AST: 28 IU/L (ref 0–40)
Albumin/Globulin Ratio: 1.9 (ref 1.2–2.2)
Albumin: 4.3 g/dL (ref 3.8–4.9)
Alkaline Phosphatase: 96 IU/L (ref 44–121)
BUN/Creatinine Ratio: 9 (ref 9–20)
BUN: 16 mg/dL (ref 6–24)
Bilirubin Total: 0.3 mg/dL (ref 0.0–1.2)
CO2: 21 mmol/L (ref 20–29)
Calcium: 9.2 mg/dL (ref 8.7–10.2)
Chloride: 106 mmol/L (ref 96–106)
Creatinine, Ser: 1.74 mg/dL — ABNORMAL HIGH (ref 0.76–1.27)
Globulin, Total: 2.3 g/dL (ref 1.5–4.5)
Glucose: 69 mg/dL — ABNORMAL LOW (ref 70–99)
Potassium: 4.4 mmol/L (ref 3.5–5.2)
Sodium: 140 mmol/L (ref 134–144)
Total Protein: 6.6 g/dL (ref 6.0–8.5)
eGFR: 47 mL/min/{1.73_m2} — ABNORMAL LOW (ref >59)

## 2021-09-14 MED ORDER — LEVOCETIRIZINE DIHYDROCHLORIDE 5 MG PO TABS
5.0000 mg | ORAL_TABLET | Freq: Every evening | ORAL | 2 refills | Status: DC
  Filled 2021-09-14: qty 90, 90d supply, fill #0
  Filled 2022-01-19: qty 90, 90d supply, fill #1
  Filled 2022-04-22: qty 90, 90d supply, fill #2

## 2021-09-14 MED ORDER — AMLODIPINE BESYLATE 10 MG PO TABS
ORAL_TABLET | Freq: Every day | ORAL | 2 refills | Status: DC
  Filled 2021-09-14: qty 90, fill #0
  Filled 2021-11-12: qty 90, 90d supply, fill #0
  Filled 2022-03-10: qty 90, 90d supply, fill #1
  Filled 2022-06-09: qty 90, 90d supply, fill #2

## 2021-09-14 MED ORDER — IPRATROPIUM BROMIDE 0.06 % NA SOLN
NASAL | 3 refills | Status: DC
  Filled 2021-09-14: qty 15, 30d supply, fill #0
  Filled 2021-11-12: qty 15, 14d supply, fill #1
  Filled 2022-06-17: qty 15, 14d supply, fill #2

## 2021-09-14 MED ORDER — OLMESARTAN MEDOXOMIL 40 MG PO TABS
ORAL_TABLET | Freq: Every day | ORAL | 2 refills | Status: DC
  Filled 2021-09-14: qty 90, fill #0
  Filled 2021-11-12: qty 90, 90d supply, fill #0
  Filled 2022-03-10: qty 90, 90d supply, fill #1
  Filled 2022-06-09: qty 90, 90d supply, fill #2

## 2021-09-14 MED ORDER — ROSUVASTATIN CALCIUM 20 MG PO TABS
ORAL_TABLET | Freq: Every day | ORAL | 2 refills | Status: DC
  Filled 2021-09-14: qty 90, 90d supply, fill #0
  Filled 2022-01-19: qty 90, 90d supply, fill #1
  Filled 2022-04-22: qty 90, 90d supply, fill #2

## 2021-09-14 NOTE — Progress Notes (Signed)
I,Dale Odom,acting as a Education administrator for Dale Greenland, MD.,have documented all relevant documentation on the behalf of Dale Greenland, MD,as directed by  Dale Greenland, MD while in the presence of Dale Greenland, MD.  This visit occurred during the SARS-CoV-2 public health emergency.  Safety protocols were in place, including screening questions prior to the visit, additional usage of staff PPE, and extensive cleaning of exam room while observing appropriate contact time as indicated for disinfecting solutions.  Subjective:     Patient ID: Dale Odom , male    DOB: 27-Mar-1971 , 51 y.o.   MRN: [id number]   Chief Complaint  Patient presents with   Annual Exam   Hypertension    HPI  He is here today for physical exam. He reports compliance with medications. He has no specific concerns or complaints at this time.   Hypertension This is a chronic problem. The current episode started more than 1 year ago. The problem has been gradually improving since onset. The problem is uncontrolled. Pertinent negatives include no blurred vision or chest pain. Past treatments include angiotensin blockers and calcium channel blockers. The current treatment provides moderate improvement. Compliance problems include exercise.  Hypertensive end-organ damage includes kidney disease.    Past Medical History:  Diagnosis Date   Cancer ([place])    right kidney   Chronic kidney disease    CKD stage 3   Hypertension    Pre-diabetes    hx of no longer     Family History  Problem Relation Age of Onset   Hypertension Mother    Hyperlipidemia Mother    Heart Problems Father    Prostate cancer Father    Dementia Father      Current Outpatient Medications:    acetaminophen (TYLENOL) 500 MG tablet, Take 1,000 mg by mouth every 6 (six) hours as needed for mild pain or headache., Disp: , Rfl:    amLODipine (NORVASC) 10 MG tablet, TAKE 1 TABLET BY MOUTH ONCE DAILY AT BEDTIME, Disp: 90 tablet, Rfl: 2    benzonatate (TESSALON) 200 MG capsule, Take 1 capsule by mouth 2 to 3 times per day as needed for cough (Patient not taking: Reported on 02/23/2021), Disp: 30 capsule, Rfl: 0   Chlorpheniramine Maleate (ALLERGY PO), Take by mouth., Disp: , Rfl:    ipratropium (ATROVENT) 0.06 % nasal spray, Place 2 sprays into each nostril 3 times per day., Disp: 15 mL, Rfl: 3   levocetirizine (XYZAL) 5 MG tablet, Take 1 tablet (5 mg total) by mouth every evening., Disp: 90 tablet, Rfl: 2   olmesartan (BENICAR) 40 MG tablet, TAKE 1 TABLET BY MOUTH ONCE DAILY, Disp: 90 tablet, Rfl: 2   oxybutynin (DITROPAN-XL) 10 MG 24 hr tablet, TAKE 1 TABLET BY MOUTH ONCE A DAY, Disp: 90 tablet, Rfl: 11   rosuvastatin (CRESTOR) 20 MG tablet, TAKE 1 TABLET BY MOUTH ONCE DAILY, Disp: 90 tablet, Rfl: 2   sildenafil (REVATIO) 20 MG tablet, Take 5 tablets by mouth as directed, Disp: 90 tablet, Rfl: 11   valACYclovir (VALTREX) 500 MG tablet, Take 500 mg by mouth as needed., Disp: , Rfl:    No Known Allergies   Men's preventive visit. Patient Health Questionnaire (PHQ-2) is  [place] Office Visit from 09/14/2021 in [facility]  PHQ-2 Total Score 0     . Patient is on a healthy diet. Marital status: Married. Relevant history for alcohol use is:  Social History   Substance and Sexual  Activity  Alcohol Use No  . Relevant history for tobacco use is:  Social History   Tobacco Use  Smoking Status Former   Packs/day: 0.25   Years: 29.00   Pack years: 7.25   Types: Cigarettes   Quit date: 06/05/2019   Years since quitting: 2.2  Smokeless Tobacco Never  Tobacco Comments   2 a day  .   Review of Systems  Constitutional: Negative.   HENT: Negative.    Eyes: Negative.  Negative for blurred vision.  Respiratory: Negative.    Cardiovascular: Negative.  Negative for chest pain.  Endocrine: Negative.   Genitourinary: Negative.   Musculoskeletal:  Positive for arthralgias.       He c/o generalized  arthralgias. He states exercise worsens his sx. He states all joints hurt - shoulders, knees, toes, hands and hips. He states he awakens with joint stiffness that improves only slightly with a topical pain cream.   Skin: Negative.   Allergic/Immunologic: Negative.   Neurological: Negative.   Hematological: Negative.   Psychiatric/Behavioral: Negative.      Today's Vitals   09/14/21 0902  BP: 126/82  Pulse: 90  Temp: 98.6 F (37 C)  Weight: 241 lb 9.6 oz (109.6 kg)  Height: 6' 0.8" (1.849 m)   Body mass index is 32.05 kg/m.  Wt Readings from Last 3 Encounters:  09/14/21 241 lb 9.6 oz (109.6 kg)  02/23/21 242 lb 3.2 oz (109.9 kg)  08/25/20 243 lb (110.2 kg)    BP Readings from Last 3 Encounters:  09/14/21 126/82  02/23/21 136/84  08/25/20 136/88    Objective:  Physical Exam Vitals and nursing note reviewed.  Constitutional:      Appearance: Normal appearance.  HENT:     Head: Normocephalic and atraumatic.     Right Ear: Tympanic membrane, ear canal and external ear normal.     Left Ear: Tympanic membrane, ear canal and external ear normal.     Nose:     Comments: Masked     Mouth/Throat:     Comments: Masked  Eyes:     Extraocular Movements: Extraocular movements intact.     Conjunctiva/sclera: Conjunctivae normal.     Pupils: Pupils are equal, round, and reactive to light.  Cardiovascular:     Rate and Rhythm: Normal rate and regular rhythm.     Pulses: Normal pulses.     Heart sounds: Normal heart sounds.  Pulmonary:     Effort: Pulmonary effort is normal.     Breath sounds: Normal breath sounds.  Chest:  Breasts:    Right: Normal. No swelling, bleeding, inverted nipple, mass or nipple discharge.     Left: Normal. No swelling, bleeding, inverted nipple, mass or nipple discharge.  Abdominal:     General: Bowel sounds are normal.     Palpations: Abdomen is soft.  Genitourinary:    Comments: Deferred  Musculoskeletal:        General: Normal range of motion.      Cervical back: Normal range of motion and neck supple.  Skin:    General: Skin is warm.     Comments: Brand on chest  Neurological:     General: No focal deficit present.     Mental Status: He is alert.  Psychiatric:        Mood and Affect: Mood normal.        Behavior: Behavior normal.        Assessment And Plan:    1. Routine general medical  examination at health care facility Comments: A full exam was performed. DRE deferred, he is followed by Urology. PATIENT IS ADVISED TO GET 30-45 MINUTES REGULAR EXERCISE NO LESS THAN FOUR TO FIVE DAYS PER WEEK - BOTH WEIGHTBEARING EXERCISES AND AEROBIC ARE RECOMMENDED.  PATIENT IS ADVISED TO FOLLOW A HEALTHY DIET WITH AT LEAST SIX FRUITS/VEGGIES PER DAY, DECREASE INTAKE OF RED MEAT, AND TO INCREASE FISH INTAKE TO TWO DAYS PER WEEK.  MEATS/FISH SHOULD NOT BE FRIED, BAKED OR BROILED IS PREFERABLE.  IT IS ALSO IMPORTANT TO CUT BACK ON YOUR SUGAR INTAKE. PLEASE AVOID ANYTHING WITH ADDED SUGAR, CORN SYRUP OR OTHER SWEETENERS. IF YOU MUST USE A SWEETENER, YOU CAN TRY STEVIA. IT IS ALSO IMPORTANT TO AVOID ARTIFICIALLY SWEETENERS AND DIET BEVERAGES. LASTLY, I SUGGEST WEARING SPF 50 SUNSCREEN ON EXPOSED PARTS AND ESPECIALLY WHEN IN THE DIRECT SUNLIGHT FOR AN EXTENDED PERIOD OF TIME.  PLEASE AVOID FAST FOOD RESTAURANTS AND INCREASE YOUR WATER INTAKE.  - CBC - Hemoglobin A1c - CMP14+EGFR - PSA - Lipid panel  2. Hypertensive nephropathy Comments: Chronic, well controlled. No med changes. EKG performed, NSR w/ nonspecific T abnormality- no new changes. He will rto in 6 months for re-evaluation.  - POCT Urinalysis Dipstick (81002) - POCT UA - Microalbumin - EKG 12-Lead  3. Stage 3a chronic kidney disease ([place]) Comments: Chronic, also followed by Nephrology. Encouraged to stay well hydrated, avoid NSAIDs and keep BP well controlled to decrease risk of CKD progression.  - Protein electrophoresis, serum - Parathyroid Hormone, Intact w/Ca -  Phosphorus  4. Vitamin D deficiency Comments: I will check vitamin D level and supplement as needed.  - Vitamin D (25 hydroxy)  5. Arthralgia, unspecified joint Comments: I will check an arthritis panel. He is encouraged to follow anti-inflammatory diet free of processed foods and sweetened beverages.  - ANA, IFA (with reflex) - CYCLIC CITRUL PEPTIDE ANTIBODY, IGG/IGA - Rheumatoid factor - Sedimentation rate - Uric acid  6. Class 1 obesity due to excess calories with serious comorbidity and body mass index (BMI) of 32.0 to 32.9 in adult Comments: He is encouraged to aim for BMI <30 to decrease cardiac risk. Advised to aim for at least 150 minutes of exercise per week.   7. Need for vaccination Comments: He was given Shingrix today. I will also request his immunization history from [facility].   8. Personal history of renal cell carcinoma Comments: He is s/p nephrectomy. He is followed by Urology.   Patient was given opportunity to ask questions. Patient verbalized understanding of the plan and was able to repeat key elements of the plan. All questions were answered to their satisfaction.   I, Dale Greenland, MD, have reviewed all documentation for this visit. The documentation on 09/14/21 for the exam, diagnosis, procedures, and orders are all accurate and complete.   THE PATIENT IS ENCOURAGED TO PRACTICE SOCIAL DISTANCING DUE TO THE COVID-19 PANDEMIC.

## 2021-09-14 NOTE — Patient Instructions (Signed)

## 2021-09-16 ENCOUNTER — Other Ambulatory Visit (HOSPITAL_COMMUNITY): Payer: Self-pay

## 2021-09-16 DIAGNOSIS — E559 Vitamin D deficiency, unspecified: Secondary | ICD-10-CM | POA: Diagnosis not present

## 2021-09-16 DIAGNOSIS — M2550 Pain in unspecified joint: Secondary | ICD-10-CM | POA: Diagnosis not present

## 2021-09-16 DIAGNOSIS — N1831 Chronic kidney disease, stage 3a: Secondary | ICD-10-CM | POA: Diagnosis not present

## 2021-09-16 DIAGNOSIS — Z0000 Encounter for general adult medical examination without abnormal findings: Secondary | ICD-10-CM | POA: Diagnosis not present

## 2021-09-17 ENCOUNTER — Encounter: Payer: Self-pay | Admitting: Internal Medicine

## 2021-09-18 LAB — PTH, INTACT AND CALCIUM
Calcium: 9 mg/dL (ref 8.7–10.2)
PTH: 85 pg/mL — ABNORMAL HIGH (ref 15–65)

## 2021-09-18 LAB — CBC
Hematocrit: 41.6 % (ref 37.5–51.0)
Hemoglobin: 14 g/dL (ref 13.0–17.7)
MCH: 28.5 pg (ref 26.6–33.0)
MCHC: 33.7 g/dL (ref 31.5–35.7)
MCV: 85 fL (ref 79–97)
Platelets: 222 10*3/uL (ref 150–450)
RBC: 4.91 x10E6/uL (ref 4.14–5.80)
RDW: 14.5 % (ref 11.6–15.4)
WBC: 4.3 10*3/uL (ref 3.4–10.8)

## 2021-09-18 LAB — LIPID PANEL
Chol/HDL Ratio: 4.6 ratio (ref 0.0–5.0)
Cholesterol, Total: 169 mg/dL (ref 100–199)
HDL: 37 mg/dL — ABNORMAL LOW (ref >39)
LDL Chol Calc (NIH): 102 mg/dL — ABNORMAL HIGH (ref 0–99)
Triglycerides: 173 mg/dL — ABNORMAL HIGH (ref 0–149)
VLDL Cholesterol Cal: 30 mg/dL (ref 5–40)

## 2021-09-18 LAB — RHEUMATOID FACTOR: Rheumatoid fact SerPl-aCnc: <10 IU/mL (ref <14.0)

## 2021-09-18 LAB — PROTEIN ELECTROPHORESIS, SERUM
A/G Ratio: 1.2 (ref 0.7–1.7)
Albumin ELP: 3.7 g/dL (ref 2.9–4.4)
Alpha 1: 0.2 g/dL (ref 0.0–0.4)
Alpha 2: 0.7 g/dL (ref 0.4–1.0)
Beta: 1.1 g/dL (ref 0.7–1.3)
Gamma Globulin: 1.1 g/dL (ref 0.4–1.8)
Globulin, Total: 3.1 g/dL (ref 2.2–3.9)
Total Protein: 6.8 g/dL (ref 6.0–8.5)

## 2021-09-18 LAB — VITAMIN D 25 HYDROXY (VIT D DEFICIENCY, FRACTURES): Vit D, 25-Hydroxy: 14.4 ng/mL — ABNORMAL LOW (ref 30.0–100.0)

## 2021-09-18 LAB — PHOSPHORUS: Phosphorus: 3.6 mg/dL (ref 2.8–4.1)

## 2021-09-18 LAB — CYCLIC CITRUL PEPTIDE ANTIBODY, IGG/IGA: Cyclic Citrullin Peptide Ab: 5 units (ref 0–19)

## 2021-09-18 LAB — HEMOGLOBIN A1C
Est. average glucose Bld gHb Est-mCnc: 120 mg/dL
Hgb A1c MFr Bld: 5.8 % — ABNORMAL HIGH (ref 4.8–5.6)

## 2021-09-18 LAB — URIC ACID: Uric Acid: 6.8 mg/dL (ref 3.8–8.4)

## 2021-09-18 LAB — ANTINUCLEAR ANTIBODIES, IFA: ANA Titer 1: NEGATIVE

## 2021-09-18 LAB — PSA: Prostate Specific Ag, Serum: 1.5 ng/mL (ref 0.0–4.0)

## 2021-09-18 LAB — SEDIMENTATION RATE: Sed Rate: 35 mm/hr — ABNORMAL HIGH (ref 0–30)

## 2021-10-27 DIAGNOSIS — H524 Presbyopia: Secondary | ICD-10-CM | POA: Diagnosis not present

## 2021-11-12 ENCOUNTER — Other Ambulatory Visit (HOSPITAL_COMMUNITY): Payer: Self-pay

## 2021-11-12 ENCOUNTER — Other Ambulatory Visit: Payer: Self-pay | Admitting: Internal Medicine

## 2021-11-12 MED ORDER — VALACYCLOVIR HCL 500 MG PO TABS
ORAL_TABLET | Freq: Two times a day (BID) | ORAL | 2 refills | Status: DC
  Filled 2021-11-12: qty 180, 90d supply, fill #0
  Filled 2022-03-10: qty 180, 90d supply, fill #1

## 2021-11-24 DIAGNOSIS — N2581 Secondary hyperparathyroidism of renal origin: Secondary | ICD-10-CM | POA: Diagnosis not present

## 2021-11-24 DIAGNOSIS — I129 Hypertensive chronic kidney disease with stage 1 through stage 4 chronic kidney disease, or unspecified chronic kidney disease: Secondary | ICD-10-CM | POA: Diagnosis not present

## 2021-11-24 DIAGNOSIS — E1122 Type 2 diabetes mellitus with diabetic chronic kidney disease: Secondary | ICD-10-CM | POA: Diagnosis not present

## 2021-11-24 DIAGNOSIS — E669 Obesity, unspecified: Secondary | ICD-10-CM | POA: Diagnosis not present

## 2021-11-24 DIAGNOSIS — N183 Chronic kidney disease, stage 3 unspecified: Secondary | ICD-10-CM | POA: Diagnosis not present

## 2021-11-24 DIAGNOSIS — E559 Vitamin D deficiency, unspecified: Secondary | ICD-10-CM | POA: Diagnosis not present

## 2021-11-24 DIAGNOSIS — E7800 Pure hypercholesterolemia, unspecified: Secondary | ICD-10-CM | POA: Diagnosis not present

## 2021-11-24 DIAGNOSIS — D631 Anemia in chronic kidney disease: Secondary | ICD-10-CM | POA: Diagnosis not present

## 2021-12-25 ENCOUNTER — Other Ambulatory Visit (HOSPITAL_COMMUNITY): Payer: Self-pay

## 2022-01-19 ENCOUNTER — Other Ambulatory Visit (HOSPITAL_COMMUNITY): Payer: Self-pay

## 2022-02-09 ENCOUNTER — Ambulatory Visit (INDEPENDENT_AMBULATORY_CARE_PROVIDER_SITE_OTHER): Payer: 59

## 2022-02-09 ENCOUNTER — Ambulatory Visit: Payer: 59 | Admitting: Physician Assistant

## 2022-02-09 DIAGNOSIS — M5440 Lumbago with sciatica, unspecified side: Secondary | ICD-10-CM

## 2022-02-09 DIAGNOSIS — M25511 Pain in right shoulder: Secondary | ICD-10-CM | POA: Diagnosis not present

## 2022-02-09 DIAGNOSIS — G8929 Other chronic pain: Secondary | ICD-10-CM

## 2022-02-09 DIAGNOSIS — M25512 Pain in left shoulder: Secondary | ICD-10-CM | POA: Diagnosis not present

## 2022-02-09 DIAGNOSIS — M545 Low back pain, unspecified: Secondary | ICD-10-CM | POA: Diagnosis not present

## 2022-02-09 NOTE — Progress Notes (Signed)
Office Visit Note   Patient: Dale Odom           Date of Birth: 01-Sep-1971           MRN: [id number] Visit Date: 02/09/2022              Requested by: Glendale Chard, [address] [place],  [place] [address] PCP: Glendale Chard, MD  Chief Complaint  Patient presents with   Lower Back - Pain   Right Shoulder - Pain      HPI: Patient is a pleasant 51 year old gentleman who works in [employer].  He presents today with a chief complaint of chronic low back pain and right greater than left shoulder pain.  He says this all stems from an accident in 2008.  He has not had any significant change in his back symptoms he is just concerned and wanted to get checked out.  He denies any paresthesias any weakness any loss of bowel or bladder control.  He has had a nephrectomy so he just takes a Tylenol occasionally when it bothers him.  He also uses Voltaren gel.  He is also asking for evaluation of right greater than left shoulder pain.  He sustained shoulder injuries as a consequence of this accident as well.  He did have shoulder surgery on the right side but not on the left.  He has had some increased pain with range of motion on the right of late.  He would like to get back into a fitness program but is concerned about doing the wrong thing for his back and shoulders  Assessment & Plan: Visit Diagnoses:  1. Chronic right shoulder pain   2. Lumbar pain   3. Acute right-sided low back pain with sciatica, sciatica laterality unspecified     Plan: He does have a grade 2 listhesis at L3 on L4.  He does not have any radicular findings and is actually very strong.  I do recommend that he go to physical therapy to learn a good back stabilization program.  We will also send him for a shoulder rotator cuff program with regards to his shoulder he has had steroid injections in the past and done well.  I will inject the right shoulder today.  He could come back in 2 weeks and have  the other shoulder injected.  He is in agreement with this plan  Follow-Up Instructions: No follow-ups on file.   Ortho Exam  Patient is alert, oriented, no adenopathy, well-dressed, normal affect, normal respiratory effort. Examination he is alert pleasant to exam.  He does have some pain with forward flexion of his back in the lower back.  No radicular findings he has some pain with bending to the right.  He has 5 out of 5 strength with resisted dorsiflexion and plantarflexion of his ankles resisted flexion and extension of his legs.  And flexion of his hips.  Negative straight leg raise bilaterally. Examination of his right shoulder  he has full forward elevation.  On the right shoulder he does have some slight decreased motion with internal rotation behind the back.  He has a positive empty can sign and impingement findings.  Mildly positive speeds sign.  Imaging: XR Lumbar Spine 2-3 Views  Result Date: 02/09/2022 Radiographs of his lumbar spine reviewed in 2 projections today.  He has no acute fractures or bony abnormalities.  He does have some degenerative changes at L3-4 and L4-5 with a grade 2 listhesis at  L3 on L4  XR Shoulder Right  Result Date: 02/09/2022 Radiographs of his right shoulder demonstrate good congruency of the humeral head in the glenoid fossa.  He does have some sclerotic changes but joint spaces maintained.  He does have some arthritic changes of the [facility] joint.  He has a subtle small circumscribed lucency in the humeral head  No images are attached to the encounter.  Labs: Lab Results  Component Value Date   HGBA1C 5.8 (H) 09/16/2021   HGBA1C 6.0 (H) 02/23/2021   HGBA1C 6.0 (H) 08/25/2020   ESRSEDRATE 35 (H) 09/16/2021   LABURIC 6.8 09/16/2021     Lab Results  Component Value Date   ALBUMIN 4.3 09/14/2021   ALBUMIN 4.3 02/23/2021   ALBUMIN 4.4 08/25/2020    No results found for: MG Lab Results  Component Value Date   VD25OH 14.4 (L) 09/16/2021    No  results found for: PREALBUMIN    Latest Ref Rng & Units 09/16/2021    3:37 PM 08/25/2020    2:50 PM 08/06/2019   12:14 PM  CBC EXTENDED  WBC 3.4 - 10.8 x10E3/uL 4.3   6.4   6.0    RBC 4.14 - 5.80 x10E6/uL 4.91   5.18   4.93    Hemoglobin 13.0 - 17.7 g/dL 14.0   14.7   13.9    HCT 37.5 - 51.0 % 41.6   42.8   42.1    Platelets 150 - 450 x10E3/uL 222   269   262       There is no height or weight on file to calculate BMI.  Orders:  Orders Placed This Encounter  Procedures   XR Shoulder Right   XR Lumbar Spine 2-3 Views   No orders of the defined types were placed in this encounter.    Procedures: No procedures performed  Clinical Data: No additional findings.  ROS:  All other systems negative, except as noted in the HPI. Review of Systems  Objective: Vital Signs: There were no vitals taken for this visit.  Specialty Comments:  No specialty comments available.  PMFS History: Patient Active Problem List   Diagnosis Date Noted   Low back pain 02/09/2022   Renal cell carcinoma, right ([place]) 09/14/2021   Pure hypercholesterolemia 09/14/2021   Class 1 obesity due to excess calories with serious comorbidity and body mass index (BMI) of 32.0 to 32.9 in adult 09/14/2021   Vitamin D deficiency 09/14/2021   Arthralgia 09/14/2021   Neoplasm of right kidney 06/07/2019   Hypertensive nephropathy 12/25/2018   Chronic renal disease, stage III ([place]) 12/25/2018   Muscle cramps 12/25/2018   Past Medical History:  Diagnosis Date   Cancer ([place])    right kidney   Chronic kidney disease    CKD stage 3   Hypertension    Pre-diabetes    hx of no longer    Family History  Problem Relation Age of Onset   Hypertension Mother    Hyperlipidemia Mother    Heart Problems Father    Prostate cancer Father    Dementia Father     Past Surgical History:  Procedure Laterality Date   HERNIA REPAIR     ROBOTIC ASSITED PARTIAL NEPHRECTOMY Right 06/07/2019   Procedure: XI ROBOTIC  ASSITED PARTIAL NEPHRECTOMY;  Surgeon: Raynelle Bring, MD;  Location: [facility];  Service: Urology;  Laterality: Right;   ROTATOR CUFF REPAIR     right   VASECTOMY     Social History  Occupational History   Not on file  Tobacco Use   Smoking status: Former    Packs/day: 0.25    Years: 29.00    Pack years: 7.25    Types: Cigarettes    Quit date: 06/05/2019    Years since quitting: 2.6   Smokeless tobacco: Never   Tobacco comments:    2 a day  Vaping Use   Vaping Use: Never used  Substance and Sexual Activity   Alcohol use: No   Drug use: Yes    Types: Marijuana   Sexual activity: Yes    Birth control/protection: Surgical

## 2022-02-11 IMAGING — DX DG CHEST 2V
2 series · 2 of 2 positions shown · non-contrast
Comparison: 05/08/2019

CLINICAL DATA: Renal cell carcinoma.

EXAM:
CHEST - 2 VIEW

[chest pa]
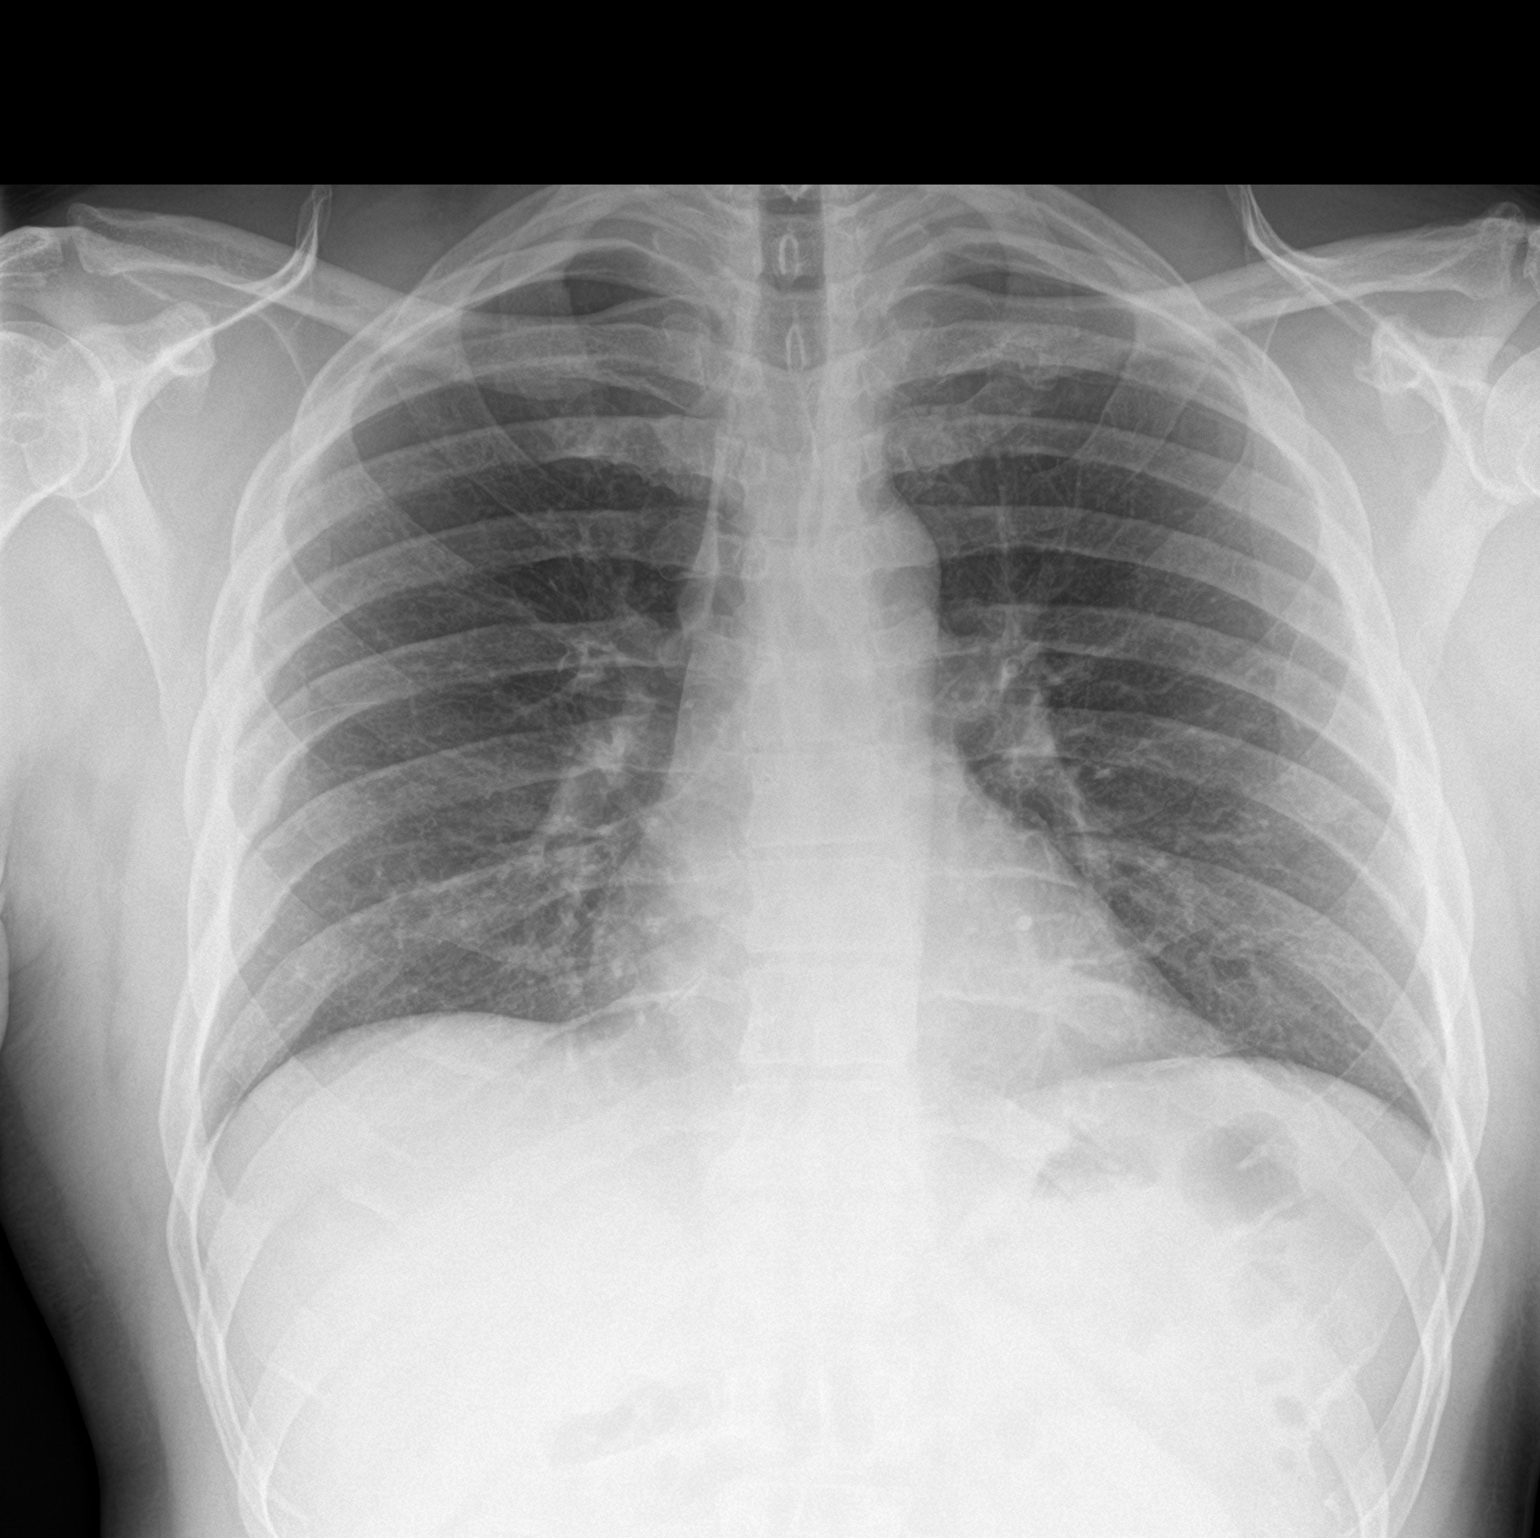

[chest lat]
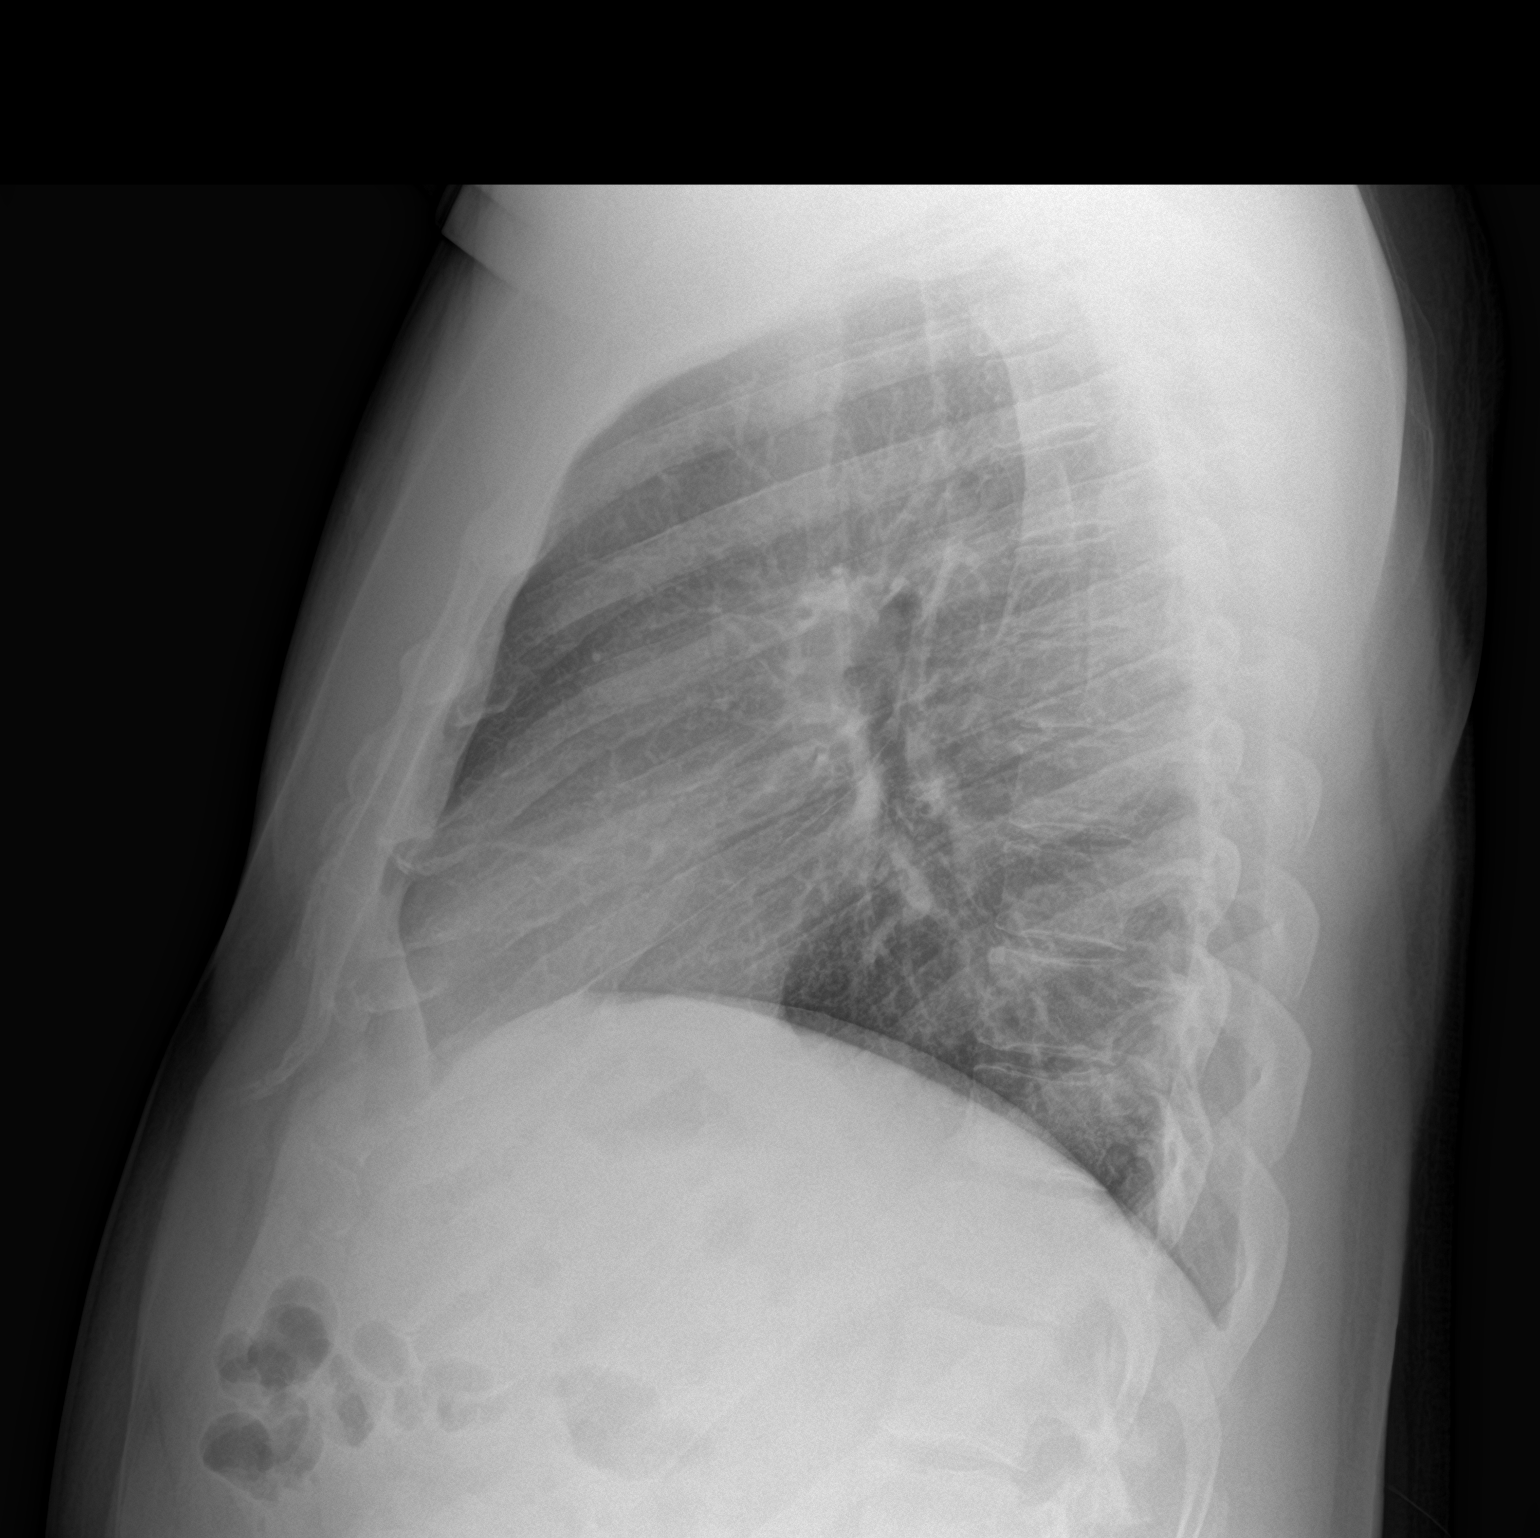

[2 of 2 positions shown; findings below may reference images not displayed]

FINDINGS: The heart size and mediastinal contours are within normal limits.
Both lungs are clear. The visualized skeletal structures are
unremarkable. Chronic healed right posterior rib deformities are
again noted and appear unchanged.
IMPRESSION: No active cardiopulmonary disease.

## 2022-02-26 DIAGNOSIS — C641 Malignant neoplasm of right kidney, except renal pelvis: Secondary | ICD-10-CM | POA: Diagnosis not present

## 2022-03-02 ENCOUNTER — Ambulatory Visit (INDEPENDENT_AMBULATORY_CARE_PROVIDER_SITE_OTHER): Payer: 59 | Admitting: Physical Therapy

## 2022-03-02 ENCOUNTER — Encounter: Payer: Self-pay | Admitting: Physical Therapy

## 2022-03-02 DIAGNOSIS — M5459 Other low back pain: Secondary | ICD-10-CM | POA: Diagnosis not present

## 2022-03-02 DIAGNOSIS — G8929 Other chronic pain: Secondary | ICD-10-CM

## 2022-03-02 DIAGNOSIS — M6281 Muscle weakness (generalized): Secondary | ICD-10-CM | POA: Diagnosis not present

## 2022-03-02 DIAGNOSIS — M25511 Pain in right shoulder: Secondary | ICD-10-CM | POA: Diagnosis not present

## 2022-03-02 DIAGNOSIS — M25512 Pain in left shoulder: Secondary | ICD-10-CM

## 2022-03-02 DIAGNOSIS — Z85528 Personal history of other malignant neoplasm of kidney: Secondary | ICD-10-CM | POA: Diagnosis not present

## 2022-03-02 DIAGNOSIS — K5730 Diverticulosis of large intestine without perforation or abscess without bleeding: Secondary | ICD-10-CM | POA: Diagnosis not present

## 2022-03-02 DIAGNOSIS — N289 Disorder of kidney and ureter, unspecified: Secondary | ICD-10-CM | POA: Diagnosis not present

## 2022-03-02 DIAGNOSIS — C641 Malignant neoplasm of right kidney, except renal pelvis: Secondary | ICD-10-CM | POA: Diagnosis not present

## 2022-03-02 NOTE — Therapy (Addendum)
OUTPATIENT PHYSICAL THERAPY SHOULDER/Lumbar EVALUATION PHYSICAL THERAPY DISCHARGE SUMMARY  Visits from Start of Care: 1  Current functional level related to goals / functional outcomes: See below   Remaining deficits: See below   Education / Equipment: HEP  Plan:  Patient goals were not met. Patient is being discharged due to not returning since last visit. Elsie Ra, PT, DPT 04/22/22 11:24 AM       Patient Name: Dale Odom MRN: [id number] DOB:[birth year], 51 y.o., male Today's Date: 03/02/2022   PT End of Session - 03/02/22 1357     Visit Number 1    Number of Visits 4    Date for PT Re-Evaluation 04/13/22    PT Start Time 1300    PT Stop Time 1346    PT Time Calculation (min) 46 min    Activity Tolerance Patient tolerated treatment well    Behavior During Therapy WFL for tasks assessed/performed             Past Medical History:  Diagnosis Date   Cancer ([place])    right kidney   Chronic kidney disease    CKD stage 3   Hypertension    Pre-diabetes    hx of no longer   Past Surgical History:  Procedure Laterality Date   HERNIA REPAIR     ROBOTIC ASSITED PARTIAL NEPHRECTOMY Right 06/07/2019   Procedure: XI ROBOTIC ASSITED PARTIAL NEPHRECTOMY;  Surgeon: Raynelle Bring, MD;  Location: [facility];  Service: Urology;  Laterality: Right;   ROTATOR CUFF REPAIR     right   VASECTOMY     Patient Active Problem List   Diagnosis Date Noted   Low back pain 02/09/2022   Bilateral shoulder pain 02/09/2022   Renal cell carcinoma, right ([place]) 09/14/2021   Pure hypercholesterolemia 09/14/2021   Class 1 obesity due to excess calories with serious comorbidity and body mass index (BMI) of 32.0 to 32.9 in adult 09/14/2021   Vitamin D deficiency 09/14/2021   Arthralgia 09/14/2021   Neoplasm of right kidney 06/07/2019   Hypertensive nephropathy 12/25/2018   Chronic renal disease, stage III ([place]) 12/25/2018   Muscle cramps 12/25/2018    PCP:  Glendale Chard,  MD  REFERRING PROVIDER:   REFERRING DIAG: Persons, Bevely Palmer, PA  THERAPY DIAG:  Other low back pain  Chronic right shoulder pain  Chronic left shoulder pain  Muscle weakness (generalized)  Rationale for Evaluation and Treatment Rehabilitation  ONSET DATE: pain since 2008  SUBJECTIVE:  SUBJECTIVE STATEMENT:  chief complaint of chronic low back pain and bilateral shoulder pain. He would like to get back in the gym because he likes to stay active and the pain is preventing him from doing this.   PERTINENT HISTORY: Rt RTC surgery 2009, previous kidney cancer   PAIN:  Are you having pain? Yes: NPRS scale: 0 at rest 10 with activity/10 Pain location: top of shoulders at distal clavicle, low back in the center. Pain description: sharp pains then stiffness Aggravating factors: any movement aggravate his shoulders, walking, getting up or rolling from bed Relieving factors: tylenol, biofreeze  PRECAUTIONS: None  WEIGHT BEARING RESTRICTIONS No  FALLS:  Has patient fallen in last 6 months? No  OCCUPATION: Mental health tech for [employer]  PLOF: Independent  PATIENT GOALS - learn exercises  OBJECTIVE:   DIAGNOSTIC FINDINGS:  Lumbar XR  "degenerative changes at L3-4 and L4-5 with a grade 2 listhesis at L3 on L4" Shoulder XR "some sclerotic changes but joint spaces maintained.  He does have some arthritic changes of the [facility] joint.  He has a subtle small circumscribed lucency in the humeral head "  PATIENT SURVEYS:  FOTO 42% functional predicted 48%  COGNITION:  Overall cognitive status: Within functional limits for tasks assessed     SENSATION: WFL  POSTURE: WFL  UPPER EXTREMITY ROM:   Active ROM Right eval Left eval  Shoulder flexion [facility]  Shoulder extension    Shoulder abduction [facility]  [facility]  Shoulder adduction    Shoulder internal rotation [facility] WFL  Shoulder external rotation [facility] WFL  Elbow flexion    Elbow extension    Lumbar flexion WNL   Lumbar extension 50% and pain   Lumbar rotation 75% bilat no pain   Lumbar lateral flexion 50% bilat some pain           (Blank rows = not tested)  UPPER EXTREMITY MMT:  MMT Right eval Left eval  Shoulder flexion 4 5  Shoulder extension    Shoulder abduction 4 5  Shoulder adduction    Shoulder internal rotation 5 5  Shoulder external rotation 4 4+  Middle trapezius    Lower trapezius    Elbow flexion    Elbow extension    Wrist flexion    Wrist extension    Wrist ulnar deviation    Wrist radial deviation    Wrist pronation    Wrist supination    Grip strength (lbs)    (Blank rows = not tested)  SHOULDER SPECIAL TESTS:  + Impingement tests bilat Lumbar special tests  Negative slump test, negative SLR test  JOINT MOBILITY TESTING:    PALPATION:  TTP lumbar spine mostly at L2-4   TODAY'S TREATMENT:  Created and Reviewed HEP listed below Reviewed sleeping postures Reviewed gym activities he is safe to do  Manual therapy for skilled palpation and active compression with DN to lumbar P.S, and multifidi. Verbal consent given, education provided. Twitch response elticed, fair to good tolerance.    PATIENT EDUCATION: Education details: HEP, PT plan of care Person educated: Patient Education method: Explanation, Demonstration, Verbal cues, and Handouts Education comprehension: verbalized understanding and needs further education   HOME EXERCISE PROGRAM: Access Code: [email or web address] URL: [email or web address] Date: 03/02/2022 Prepared by: Elsie Ra  Exercises - Supine Hamstring Stretch  - 2 x daily - 6 x weekly - 1 sets - 3 reps - 30 hold - Supine Bridge  - 2 x daily - 6 x weekly - 1-2 sets -  10 reps - 5 hold - Hooklying Sequential Leg March and Lower  - 2 x daily - 6 x weekly - 1-2 sets - 10  reps - Bird Dog  - 2 x daily - 6 x weekly - 1 sets - 10 reps - 5 sec hold - Standing Shoulder External Rotation with Resistance  - 2 x daily - 6 x weekly - 1-2 sets - 15 reps - Standing Shoulder Horizontal Abduction with Resistance  - 2 x daily - 6 x weekly - 1-2 sets - 15 reps - Standing Shoulder Extension with Resistance  - 2 x daily - 6 x weekly - 1-2 sets - 15 reps - Standing Row with Resistance  - 2 x daily - 6 x weekly - 1-2 sets - 15 reps   ASSESSMENT:  CLINICAL IMPRESSION: Patient presents with signs and symptoms consistent with chronic low back pain with motor control deficits and degenerative changes at L3-4 and L4-5 with a grade 2 listhesis at L3 on L4. He also has chronic bilateral shoulder pain with impingement signs and OA, with previous RTC surgery on Rt shoulder in 2009 per patient report. Patient will benefit from skilled PT to address below impairments, limitations and improve overall function. He will trial HEP for now and I encouraged him to set up another appointment in one month to check back in, he can certainly come back sooner if he would like if DN helped him today.   OBJECTIVE IMPAIRMENTS: decreased activity tolerance, difficulty walking, decreased endurance, decreased mobility, decreased ROM, decreased strength, impaired flexibility, impaired UE/LE use,  and pain.  ACTIVITY LIMITATIONS: bending, lifting, carry, locomotion, cleaning, community activity, driving, and or occupation  PERSONAL FACTORS: see above PMH, are also affecting patient's functional outcome.  REHAB POTENTIAL: Good  CLINICAL DECISION MAKING: Stable/uncomplicated  EVALUATION COMPLEXITY: Low    GOALS: Short term PT Goals Target date: 03/30/2022 Pt will be I and compliant with HEP. Baseline:  Goal status: New  Long term PT goals Target date: 04/13/2022 Pt will improve  Rt shoulder strength to at least 5-/5 MMT to improve functional strength Baseline: Goal status: New Pt will improve FOTO to  at least % functional to show improved function Baseline: Goal status: New Pt will reduce pain by overall 48% overall with usual activity Baseline: Goal status: New Pt will reduce pain to overall less than 3/10 with usual activity and work activity. Baseline: Goal status: New  PLAN: PT FREQUENCY: 1-3 times per week   PT DURATION: 6-8 weeks  PLANNED INTERVENTIONS (unless contraindicated): aquatic PT, Canalith repositioning, cryotherapy, Electrical stimulation, Iontophoresis with 4 mg/ml dexamethasome, Moist heat, traction, Ultrasound, gait training, Therapeutic exercise, balance training, neuromuscular re-education, patient/family education, prosthetic training, manual techniques, passive ROM, dry needling, taping, vasopnuematic device, vestibular, spinal manipulations, joint manipulations  PLAN FOR NEXT SESSION: how was DN? How is HEP going?    Debbe Odea, PT,DPT 03/02/2022, 1:58 PM

## 2022-03-05 DIAGNOSIS — Z85528 Personal history of other malignant neoplasm of kidney: Secondary | ICD-10-CM | POA: Diagnosis not present

## 2022-03-05 DIAGNOSIS — N5201 Erectile dysfunction due to arterial insufficiency: Secondary | ICD-10-CM | POA: Diagnosis not present

## 2022-03-10 ENCOUNTER — Other Ambulatory Visit (HOSPITAL_COMMUNITY): Payer: Self-pay

## 2022-03-10 MED ORDER — SILDENAFIL CITRATE 20 MG PO TABS
ORAL_TABLET | ORAL | 11 refills | Status: DC
  Filled 2022-03-10: qty 90, 18d supply, fill #0
  Filled 2022-04-22: qty 90, 18d supply, fill #1
  Filled 2022-06-09: qty 90, 18d supply, fill #2
  Filled 2022-09-13: qty 90, 18d supply, fill #3
  Filled 2022-10-26 – 2022-11-05 (×3): qty 90, 18d supply, fill #4
  Filled 2022-12-16: qty 90, 18d supply, fill #5
  Filled 2023-02-07 (×2): qty 90, 18d supply, fill #6

## 2022-03-15 ENCOUNTER — Encounter: Payer: Self-pay | Admitting: Internal Medicine

## 2022-03-15 ENCOUNTER — Ambulatory Visit: Payer: 59 | Admitting: Internal Medicine

## 2022-03-15 VITALS — BP 122/80 | HR 69 | Temp 97.7°F | Ht 72.8 in | Wt 247.0 lb

## 2022-03-15 DIAGNOSIS — I129 Hypertensive chronic kidney disease with stage 1 through stage 4 chronic kidney disease, or unspecified chronic kidney disease: Secondary | ICD-10-CM | POA: Diagnosis not present

## 2022-03-15 DIAGNOSIS — Z23 Encounter for immunization: Secondary | ICD-10-CM | POA: Diagnosis not present

## 2022-03-15 DIAGNOSIS — E6609 Other obesity due to excess calories: Secondary | ICD-10-CM

## 2022-03-15 DIAGNOSIS — R7309 Other abnormal glucose: Secondary | ICD-10-CM

## 2022-03-15 DIAGNOSIS — Z6832 Body mass index (BMI) 32.0-32.9, adult: Secondary | ICD-10-CM | POA: Diagnosis not present

## 2022-03-15 DIAGNOSIS — N1831 Chronic kidney disease, stage 3a: Secondary | ICD-10-CM

## 2022-03-15 NOTE — Patient Instructions (Signed)
Hypertension, Adult ?Hypertension is another name for high blood pressure. High blood pressure forces your heart to work harder to pump blood. This can cause problems over time. ?There are two numbers in a blood pressure reading. There is a top number (systolic) over a bottom number (diastolic). It is best to have a blood pressure that is below 120/80. ?What are the causes? ?The cause of this condition is not known. Some other conditions can lead to high blood pressure. ?What increases the risk? ?Some lifestyle factors can make you more likely to develop high blood pressure: ?Smoking. ?Not getting enough exercise or physical activity. ?Being overweight. ?Having too much fat, sugar, calories, or salt (sodium) in your diet. ?Drinking too much alcohol. ?Other risk factors include: ?Having any of these conditions: ?Heart disease. ?Diabetes. ?High cholesterol. ?Kidney disease. ?Obstructive sleep apnea. ?Having a family history of high blood pressure and high cholesterol. ?Age. The risk increases with age. ?Stress. ?What are the signs or symptoms? ?High blood pressure may not cause symptoms. Very high blood pressure (hypertensive crisis) may cause: ?Headache. ?Fast or uneven heartbeats (palpitations). ?Shortness of breath. ?Nosebleed. ?Vomiting or feeling like you may vomit (nauseous). ?Changes in how you see. ?Very bad chest pain. ?Feeling dizzy. ?Seizures. ?How is this treated? ?This condition is treated by making healthy lifestyle changes, such as: ?Eating healthy foods. ?Exercising more. ?Drinking less alcohol. ?Your doctor may prescribe medicine if lifestyle changes do not help enough and if: ?Your top number is above 130. ?Your bottom number is above 80. ?Your personal target blood pressure may vary. ?Follow these instructions at home: ?Eating and drinking ? ?If told, follow the DASH eating plan. To follow this plan: ?Fill one half of your plate at each meal with fruits and vegetables. ?Fill one fourth of your plate  at each meal with whole grains. Whole grains include whole-wheat pasta, brown rice, and whole-grain bread. ?Eat or drink low-fat dairy products, such as skim milk or low-fat yogurt. ?Fill one fourth of your plate at each meal with low-fat (lean) proteins. Low-fat proteins include fish, chicken without skin, eggs, beans, and tofu. ?Avoid fatty meat, cured and processed meat, or chicken with skin. ?Avoid pre-made or processed food. ?Limit the amount of salt in your diet to less than 1,500 mg each day. ?Do not drink alcohol if: ?Your doctor tells you not to drink. ?You are pregnant, may be pregnant, or are planning to become pregnant. ?If you drink alcohol: ?Limit how much you have to: ?0-1 drink a day for women. ?0-2 drinks a day for men. ?Know how much alcohol is in your drink. In the [place] one drink equals one 12 oz bottle of beer (355 mL), one 5 oz glass of wine (148 mL), or one 1? oz glass of hard liquor (44 mL). ?Lifestyle ? ?Work with your doctor to stay at a healthy weight or to lose weight. Ask your doctor what the best weight is for you. ?Get at least 30 minutes of exercise that causes your heart to beat faster (aerobic exercise) most days of the week. This may include walking, swimming, or biking. ?Get at least 30 minutes of exercise that strengthens your muscles (resistance exercise) at least 3 days a week. This may include lifting weights or doing Pilates. ?Do not smoke or use any products that contain nicotine or tobacco. If you need help quitting, ask your doctor. ?Check your blood pressure at home as told by your doctor. ?Keep all follow-up visits. ?Medicines ?Take over-the-counter and prescription medicines   only as told by your doctor. Follow directions carefully. ?Do not skip doses of blood pressure medicine. The medicine does not work as well if you skip doses. Skipping doses also puts you at risk for problems. ?Ask your doctor about side effects or reactions to medicines that you should watch  for. ?Contact a doctor if: ?You think you are having a reaction to the medicine you are taking. ?You have headaches that keep coming back. ?You feel dizzy. ?You have swelling in your ankles. ?You have trouble with your vision. ?Get help right away if: ?You get a very bad headache. ?You start to feel mixed up (confused). ?You feel weak or numb. ?You feel faint. ?You have very bad pain in your: ?Chest. ?Belly (abdomen). ?You vomit more than once. ?You have trouble breathing. ?These symptoms may be an emergency. Get help right away. Call 911. ?Do not wait to see if the symptoms will go away. ?Do not drive yourself to the hospital. ?Summary ?Hypertension is another name for high blood pressure. ?High blood pressure forces your heart to work harder to pump blood. ?For most people, a normal blood pressure is less than 120/80. ?Making healthy choices can help lower blood pressure. If your blood pressure does not get lower with healthy choices, you may need to take medicine. ?This information is not intended to replace advice given to you by your health care provider. Make sure you discuss any questions you have with your health care provider. ?Document Revised: 06/11/2021 Document Reviewed: 06/11/2021 ?Elsevier Patient Education ? 2023 [employer] ? ?

## 2022-03-15 NOTE — Progress Notes (Signed)
Rich Brave Llittleton,acting as a Education administrator for Maximino Greenland, MD.,have documented all relevant documentation on the behalf of Maximino Greenland, MD,as directed by  Maximino Greenland, MD while in the presence of Maximino Greenland, MD.    Subjective:     Patient ID: Dale Odom , male    DOB: 1971/06/10 , 51 y.o.   MRN: [id number]   Chief Complaint  Patient presents with   Hypertension    HPI  He is here today for bp check.  He reports compliance with meds. He feels well on his current regimen. He has no specific concerns or complaints today. He reports having a hectic weekend, states he otherwise feels fine.   Hypertension This is a chronic problem. The current episode started more than 1 year ago. The problem has been gradually improving since onset. The problem is uncontrolled. Pertinent negatives include no blurred vision. Risk factors for coronary artery disease include dyslipidemia and male gender. Past treatments include angiotensin blockers and calcium channel blockers. The current treatment provides moderate improvement. Compliance problems include exercise.  Hypertensive end-organ damage includes kidney disease.     Past Medical History:  Diagnosis Date   Cancer ([place])    right kidney   Chronic kidney disease    CKD stage 3   Hypertension    Pre-diabetes    hx of no longer     Family History  Problem Relation Age of Onset   Hypertension Mother    Hyperlipidemia Mother    Heart Problems Father    Prostate cancer Father    Dementia Father      Current Outpatient Medications:    acetaminophen (TYLENOL) 500 MG tablet, Take 1,000 mg by mouth every 6 (six) hours as needed for mild pain or headache., Disp: , Rfl:    amLODipine (NORVASC) 10 MG tablet, TAKE 1 TABLET BY MOUTH ONCE DAILY AT BEDTIME, Disp: 90 tablet, Rfl: 2   Chlorpheniramine Maleate (ALLERGY PO), Take by mouth., Disp: , Rfl:    ipratropium (ATROVENT) 0.06 % nasal spray, Place 2 sprays into each nostril 3 times  per day., Disp: 15 mL, Rfl: 3   levocetirizine (XYZAL) 5 MG tablet, Take 1 tablet (5 mg total) by mouth every evening., Disp: 90 tablet, Rfl: 2   olmesartan (BENICAR) 40 MG tablet, TAKE 1 TABLET BY MOUTH ONCE DAILY, Disp: 90 tablet, Rfl: 2   oxybutynin (DITROPAN-XL) 10 MG 24 hr tablet, TAKE 1 TABLET BY MOUTH ONCE A DAY, Disp: 90 tablet, Rfl: 11   rosuvastatin (CRESTOR) 20 MG tablet, TAKE 1 TABLET BY MOUTH ONCE DAILY, Disp: 90 tablet, Rfl: 2   sildenafil (REVATIO) 20 MG tablet, Take 5 tablets by mouth as directed, Disp: 90 tablet, Rfl: 11   valACYclovir (VALTREX) 500 MG tablet, TAKE 1 TABLET BY MOUTH TWICE DAILY, Disp: 180 tablet, Rfl: 2   benzonatate (TESSALON) 200 MG capsule, Take 1 capsule by mouth 2 to 3 times per day as needed for cough (Patient not taking: Reported on 02/23/2021), Disp: 30 capsule, Rfl: 0   No Known Allergies   Review of Systems  Constitutional: Negative.   Eyes: Negative.  Negative for blurred vision.  Respiratory: Negative.    Cardiovascular: Negative.   Gastrointestinal: Negative.   Musculoskeletal: Negative.   Skin: Negative.   Neurological: Negative.   Psychiatric/Behavioral: Negative.       Today's Vitals   03/15/22 0923  BP: 122/80  Pulse: 69  Temp: 97.7 F (36.5 C)  Weight: 247 lb (  112 kg)  Height: 6' 0.8" (1.849 m)  PainSc: 0-No pain   Body mass index is 32.77 kg/m.  Wt Readings from Last 3 Encounters:  03/15/22 247 lb (112 kg)  09/14/21 241 lb 9.6 oz (109.6 kg)  02/23/21 242 lb 3.2 oz (109.9 kg)     Objective:  Physical Exam Vitals and nursing note reviewed.  Constitutional:      Appearance: Normal appearance.  HENT:     Head: Normocephalic and atraumatic.  Eyes:     Extraocular Movements: Extraocular movements intact.  Cardiovascular:     Rate and Rhythm: Normal rate and regular rhythm.     Heart sounds: Normal heart sounds.  Pulmonary:     Effort: Pulmonary effort is normal.     Breath sounds: Normal breath sounds.   Musculoskeletal:     Cervical back: Normal range of motion.     Right lower leg: Edema present.     Left lower leg: Edema present.  Skin:    General: Skin is warm.  Neurological:     General: No focal deficit present.     Mental Status: He is alert.  Psychiatric:        Mood and Affect: Mood normal.      Assessment And Plan:     1. Hypertensive nephropathy Comments: Chronic, well controlled. He is encouraged to follow a low sodium diet. He will c/w olmesartan and amlodipine. LE swelling likely due to amlodipine. - CMP14+EGFR  2. Stage 3a chronic kidney disease ([place]) Comments: Chronic, will request most recent records from [facility]. He will f/u in six months for his next physical examination.  - PTH, intact and calcium - Phosphorus - Protein electrophoresis, serum  3. Other abnormal glucose Comments: His a1c has been elevated in the past. I will recheck this today. He is encouraged to limit his intake of sugary foods and beverages.  - Hemoglobin A1c  4. Class 1 obesity due to excess calories with serious comorbidity and body mass index (BMI) of 32.0 to 32.9 in adult Comments: He is aware of 7 lb weight gain. He is encouraged to aim for at least 150 minutes of exercise per week.   5. Immunization due - Varicella-zoster vaccine IM (Shingrix)   Patient was given opportunity to ask questions. Patient verbalized understanding of the plan and was able to repeat key elements of the plan. All questions were answered to their satisfaction.   I, Maximino Greenland, MD, have reviewed all documentation for this visit. The documentation on 03/15/22 for the exam, diagnosis, procedures, and orders are all accurate and complete.   IF YOU HAVE BEEN REFERRED TO A SPECIALIST, IT MAY TAKE 1-2 WEEKS TO SCHEDULE/PROCESS THE REFERRAL. IF YOU HAVE NOT HEARD FROM US/SPECIALIST IN TWO WEEKS, PLEASE GIVE [place] A CALL AT [phone].   THE PATIENT IS ENCOURAGED TO PRACTICE SOCIAL DISTANCING DUE  TO THE COVID-19 PANDEMIC.

## 2022-03-18 LAB — PROTEIN ELECTROPHORESIS, SERUM
A/G Ratio: 1.5 (ref 0.7–1.7)
Albumin ELP: 3.8 g/dL (ref 2.9–4.4)
Alpha 1: 0.2 g/dL (ref 0.0–0.4)
Alpha 2: 0.5 g/dL (ref 0.4–1.0)
Beta: 1 g/dL (ref 0.7–1.3)
Gamma Globulin: 0.9 g/dL (ref 0.4–1.8)
Globulin, Total: 2.6 g/dL (ref 2.2–3.9)

## 2022-03-18 LAB — PTH, INTACT AND CALCIUM: PTH: 70 pg/mL — ABNORMAL HIGH (ref 15–65)

## 2022-03-18 LAB — CMP14+EGFR
ALT: 28 IU/L (ref 0–44)
AST: 29 IU/L (ref 0–40)
Albumin/Globulin Ratio: 1.7 (ref 1.2–2.2)
Albumin: 4 g/dL (ref 3.8–4.9)
Alkaline Phosphatase: 88 IU/L (ref 44–121)
BUN/Creatinine Ratio: 7 — ABNORMAL LOW (ref 9–20)
BUN: 13 mg/dL (ref 6–24)
Bilirubin Total: <0.2 mg/dL (ref 0.0–1.2)
CO2: 20 mmol/L (ref 20–29)
Calcium: 9.3 mg/dL (ref 8.7–10.2)
Chloride: 107 mmol/L — ABNORMAL HIGH (ref 96–106)
Creatinine, Ser: 1.89 mg/dL — ABNORMAL HIGH (ref 0.76–1.27)
Globulin, Total: 2.4 g/dL (ref 1.5–4.5)
Glucose: 98 mg/dL (ref 70–99)
Potassium: 4.7 mmol/L (ref 3.5–5.2)
Sodium: 140 mmol/L (ref 134–144)
Total Protein: 6.4 g/dL (ref 6.0–8.5)
eGFR: 42 mL/min/{1.73_m2} — ABNORMAL LOW (ref >59)

## 2022-03-18 LAB — HEMOGLOBIN A1C
Est. average glucose Bld gHb Est-mCnc: 126 mg/dL
Hgb A1c MFr Bld: 6 % — ABNORMAL HIGH (ref 4.8–5.6)

## 2022-03-18 LAB — PHOSPHORUS: Phosphorus: 3.4 mg/dL (ref 2.8–4.1)

## 2022-04-22 ENCOUNTER — Other Ambulatory Visit (HOSPITAL_COMMUNITY): Payer: Self-pay

## 2022-06-09 ENCOUNTER — Other Ambulatory Visit (HOSPITAL_COMMUNITY): Payer: Self-pay

## 2022-06-09 MED ORDER — OXYBUTYNIN CHLORIDE ER 10 MG PO TB24
10.0000 mg | ORAL_TABLET | Freq: Every day | ORAL | 11 refills | Status: DC
  Filled 2022-06-09: qty 90, 90d supply, fill #0
  Filled 2022-09-13: qty 90, 90d supply, fill #1
  Filled 2022-12-16: qty 90, 90d supply, fill #2
  Filled 2023-03-11: qty 90, 90d supply, fill #3

## 2022-06-17 ENCOUNTER — Other Ambulatory Visit (HOSPITAL_COMMUNITY): Payer: Self-pay

## 2022-07-22 ENCOUNTER — Other Ambulatory Visit (HOSPITAL_COMMUNITY): Payer: Self-pay

## 2022-07-22 ENCOUNTER — Other Ambulatory Visit: Payer: Self-pay | Admitting: Internal Medicine

## 2022-07-22 MED ORDER — ROSUVASTATIN CALCIUM 20 MG PO TABS
20.0000 mg | ORAL_TABLET | Freq: Every day | ORAL | 2 refills | Status: DC
  Filled 2022-07-22: qty 90, 90d supply, fill #0
  Filled 2022-10-26 – 2022-11-05 (×3): qty 90, 90d supply, fill #1
  Filled 2023-02-07 (×2): qty 90, 90d supply, fill #2

## 2022-07-22 MED ORDER — LEVOCETIRIZINE DIHYDROCHLORIDE 5 MG PO TABS
5.0000 mg | ORAL_TABLET | Freq: Every evening | ORAL | 2 refills | Status: DC
  Filled 2022-07-22: qty 90, 90d supply, fill #0
  Filled 2022-10-26 – 2022-11-05 (×3): qty 90, 90d supply, fill #1
  Filled 2023-02-07 (×2): qty 90, 90d supply, fill #2

## 2022-07-23 ENCOUNTER — Other Ambulatory Visit (HOSPITAL_COMMUNITY): Payer: Self-pay

## 2022-09-13 ENCOUNTER — Other Ambulatory Visit (HOSPITAL_COMMUNITY): Payer: Self-pay

## 2022-09-13 ENCOUNTER — Other Ambulatory Visit: Payer: Self-pay | Admitting: Internal Medicine

## 2022-09-13 MED ORDER — AMLODIPINE BESYLATE 10 MG PO TABS
10.0000 mg | ORAL_TABLET | Freq: Every day | ORAL | 2 refills | Status: DC
  Filled 2022-09-13: qty 90, 90d supply, fill #0
  Filled 2022-12-16: qty 90, 90d supply, fill #1
  Filled 2023-03-11: qty 90, 90d supply, fill #2

## 2022-09-13 MED ORDER — OLMESARTAN MEDOXOMIL 40 MG PO TABS
40.0000 mg | ORAL_TABLET | Freq: Every day | ORAL | 2 refills | Status: DC
  Filled 2022-09-13: qty 90, 90d supply, fill #0
  Filled 2022-12-16: qty 90, 90d supply, fill #1
  Filled 2023-03-11: qty 90, 90d supply, fill #2

## 2022-09-14 ENCOUNTER — Other Ambulatory Visit (HOSPITAL_COMMUNITY): Payer: Self-pay

## 2022-09-27 ENCOUNTER — Ambulatory Visit: Payer: Commercial Managed Care - PPO | Admitting: Internal Medicine

## 2022-09-27 ENCOUNTER — Encounter: Payer: Self-pay | Admitting: Internal Medicine

## 2022-09-27 VITALS — BP 110/70 | HR 67 | Temp 98.3°F | Ht 72.8 in | Wt 243.2 lb

## 2022-09-27 DIAGNOSIS — E7800 Pure hypercholesterolemia, unspecified: Secondary | ICD-10-CM

## 2022-09-27 DIAGNOSIS — E559 Vitamin D deficiency, unspecified: Secondary | ICD-10-CM | POA: Diagnosis not present

## 2022-09-27 DIAGNOSIS — Z0000 Encounter for general adult medical examination without abnormal findings: Secondary | ICD-10-CM | POA: Diagnosis not present

## 2022-09-27 DIAGNOSIS — N1831 Chronic kidney disease, stage 3a: Secondary | ICD-10-CM

## 2022-09-27 DIAGNOSIS — I129 Hypertensive chronic kidney disease with stage 1 through stage 4 chronic kidney disease, or unspecified chronic kidney disease: Secondary | ICD-10-CM | POA: Diagnosis not present

## 2022-09-27 DIAGNOSIS — Z1211 Encounter for screening for malignant neoplasm of colon: Secondary | ICD-10-CM | POA: Diagnosis not present

## 2022-09-27 DIAGNOSIS — R7309 Other abnormal glucose: Secondary | ICD-10-CM | POA: Diagnosis not present

## 2022-09-27 LAB — POCT URINALYSIS DIPSTICK
Bilirubin, UA: NEGATIVE
Glucose, UA: NEGATIVE
Leukocytes, UA: NEGATIVE
Nitrite, UA: NEGATIVE
Protein, UA: POSITIVE — AB
Spec Grav, UA: >=1.03 — AB (ref 1.010–1.025)
Urobilinogen, UA: 0.2 E.U./dL
pH, UA: 5.5 (ref 5.0–8.0)

## 2022-09-27 NOTE — Patient Instructions (Addendum)
Start vitamin D3 1000 unit capsules once daily  Health Maintenance, Male Adopting a healthy lifestyle and getting preventive care are important in promoting health and wellness. Ask your health care provider about: The right schedule for you to have regular tests and exams. Things you can do on your own to prevent diseases and keep yourself healthy. What should I know about diet, weight, and exercise? Eat a healthy diet  Eat a diet that includes plenty of vegetables, fruits, low-fat dairy products, and lean protein. Do not eat a lot of foods that are high in solid fats, added sugars, or sodium. Maintain a healthy weight Body mass index (BMI) is a measurement that can be used to identify possible weight problems. It estimates body fat based on height and weight. Your health care provider can help determine your BMI and help you achieve or maintain a healthy weight. Get regular exercise Get regular exercise. This is one of the most important things you can do for your health. Most adults should: Exercise for at least 150 minutes each week. The exercise should increase your heart rate and make you sweat (moderate-intensity exercise). Do strengthening exercises at least twice a week. This is in addition to the moderate-intensity exercise. Spend less time sitting. Even light physical activity can be beneficial. Watch cholesterol and blood lipids Have your blood tested for lipids and cholesterol at 53 years of age, then have this test every 5 years. You may need to have your cholesterol levels checked more often if: Your lipid or cholesterol levels are high. You are older than 53 years of age. You are at high risk for heart disease. What should I know about cancer screening? Many types of cancers can be detected early and may often be prevented. Depending on your health history and family history, you may need to have cancer screening at various ages. This may include screening for: Colorectal  cancer. Prostate cancer. Skin cancer. Lung cancer. What should I know about heart disease, diabetes, and high blood pressure? Blood pressure and heart disease High blood pressure causes heart disease and increases the risk of stroke. This is more likely to develop in people who have high blood pressure readings or are overweight. Talk with your health care provider about your target blood pressure readings. Have your blood pressure checked: Every 3-5 years if you are 58-74 years of age. Every year if you are 49 years old or older. If you are between the ages of 34 and 61 and are a current or former smoker, ask your health care provider if you should have a one-time screening for abdominal aortic aneurysm (AAA). Diabetes Have regular diabetes screenings. This checks your fasting blood sugar level. Have the screening done: Once every three years after age 20 if you are at a normal weight and have a low risk for diabetes. More often and at a younger age if you are overweight or have a high risk for diabetes. What should I know about preventing infection? Hepatitis B If you have a higher risk for hepatitis B, you should be screened for this virus. Talk with your health care provider to find out if you are at risk for hepatitis B infection. Hepatitis C Blood testing is recommended for: Everyone born from [birth year] through 1965. Anyone with known risk factors for hepatitis C. Sexually transmitted infections (STIs) You should be screened each year for STIs, including gonorrhea and chlamydia, if: You are sexually active and are younger than 53 years of age. You  are older than 53 years of age and your health care provider tells you that you are at risk for this type of infection. Your sexual activity has changed since you were last screened, and you are at increased risk for chlamydia or gonorrhea. Ask your health care provider if you are at risk. Ask your health care provider about whether you are at  high risk for HIV. Your health care provider may recommend a prescription medicine to help prevent HIV infection. If you choose to take medicine to prevent HIV, you should first get tested for HIV. You should then be tested every 3 months for as long as you are taking the medicine. Follow these instructions at home: Alcohol use Do not drink alcohol if your health care provider tells you not to drink. If you drink alcohol: Limit how much you have to 0-2 drinks a day. Know how much alcohol is in your drink. In the [place] one drink equals one 12 oz bottle of beer (355 mL), one 5 oz glass of wine (148 mL), or one 1 oz glass of hard liquor (44 mL). Lifestyle Do not use any products that contain nicotine or tobacco. These products include cigarettes, chewing tobacco, and vaping devices, such as e-cigarettes. If you need help quitting, ask your health care provider. Do not use street drugs. Do not share needles. Ask your health care provider for help if you need support or information about quitting drugs. General instructions Schedule regular health, dental, and eye exams. Stay current with your vaccines. Tell your health care provider if: You often feel depressed. You have ever been abused or do not feel safe at home. Summary Adopting a healthy lifestyle and getting preventive care are important in promoting health and wellness. Follow your health care provider's instructions about healthy diet, exercising, and getting tested or screened for diseases. Follow your health care provider's instructions on monitoring your cholesterol and blood pressure. This information is not intended to replace advice given to you by your health care provider. Make sure you discuss any questions you have with your health care provider. Document Revised: 01/12/2021 Document Reviewed: 01/12/2021 Elsevier Patient Education  [place].

## 2022-09-27 NOTE — Progress Notes (Signed)
Rich Brave Llittleton,acting as a Education administrator for Maximino Greenland, MD.,have documented all relevant documentation on the behalf of Maximino Greenland, MD,as directed by  Maximino Greenland, MD while in the presence of Maximino Greenland, MD.   Subjective:     Patient ID: Dale Odom , male    DOB: [birth year] , 52 y.o.   MRN: [id number]   Chief Complaint  Patient presents with   Annual Exam    HPI  He is here today for physical exam. He reports compliance with medications. He has no specific concerns or complaints at this time.   Hypertension This is a chronic problem. The current episode started more than 1 year ago. The problem has been gradually improving since onset. The problem is uncontrolled. Pertinent negatives include no blurred vision. Past treatments include angiotensin blockers and calcium channel blockers. The current treatment provides moderate improvement. Compliance problems include exercise.  Hypertensive end-organ damage includes kidney disease.     Past Medical History:  Diagnosis Date   Cancer ([place])    right kidney   Chronic kidney disease    CKD stage 3   Hypertension    Pre-diabetes    hx of no longer     Family History  Problem Relation Age of Onset   Hypertension Mother    Hyperlipidemia Mother    Heart Problems Father    Prostate cancer Father    Dementia Father      Current Outpatient Medications:    acetaminophen (TYLENOL) 500 MG tablet, Take 1,000 mg by mouth every 6 (six) hours as needed for mild pain or headache., Disp: , Rfl:    amLODipine (NORVASC) 10 MG tablet, Take 1 tablet (10 mg total) by mouth at bedtime., Disp: 90 tablet, Rfl: 2   benzonatate (TESSALON) 200 MG capsule, Take 1 capsule by mouth 2 to 3 times per day as needed for cough, Disp: 30 capsule, Rfl: 0   Chlorpheniramine Maleate (ALLERGY PO), Take by mouth., Disp: , Rfl:    ipratropium (ATROVENT) 0.06 % nasal spray, Place 2 sprays into each nostril 3 times per day., Disp: 15 mL, Rfl: 3    levocetirizine (XYZAL) 5 MG tablet, Take 1 tablet (5 mg total) by mouth every evening., Disp: 90 tablet, Rfl: 2   olmesartan (BENICAR) 40 MG tablet, Take 1 tablet (40 mg total) by mouth daily., Disp: 90 tablet, Rfl: 2   oxybutynin (DITROPAN-XL) 10 MG 24 hr tablet, Take 1 tablet (10 mg total) by mouth daily., Disp: 90 tablet, Rfl: 11   rosuvastatin (CRESTOR) 20 MG tablet, Take 1 tablet (20 mg total) by mouth daily., Disp: 90 tablet, Rfl: 2   sildenafil (REVATIO) 20 MG tablet, Take 5 tablets by mouth as directed, Disp: 90 tablet, Rfl: 11   valACYclovir (VALTREX) 500 MG tablet, TAKE 1 TABLET BY MOUTH TWICE DAILY, Disp: 180 tablet, Rfl: 2   No Known Allergies   Men's preventive visit. Patient Health Questionnaire (PHQ-2) is  [place] Office Visit from 09/27/2022 in [facility]  PHQ-2 Total Score 0     . Patient is on a low sodium diet. Marital status: Married. Relevant history for alcohol use is:  Social History   Substance and Sexual Activity  Alcohol Use No  . Relevant history for tobacco use is:  Social History   Tobacco Use  Smoking Status Former   Packs/day: 0.25   Years: 29.00   Total pack years: 7.25   Types: Cigarettes  Quit date: 06/05/2019   Years since quitting: 3.3  Smokeless Tobacco Never  Tobacco Comments   2 a day  .   Review of Systems  Constitutional: Negative.   HENT: Negative.    Eyes: Negative.  Negative for blurred vision.  Respiratory: Negative.    Cardiovascular: Negative.   Gastrointestinal: Negative.   Endocrine: Negative.   Genitourinary: Negative.   Musculoskeletal: Negative.   Skin: Negative.   Neurological: Negative.   Hematological: Negative.   Psychiatric/Behavioral: Negative.       Today's Vitals   09/27/22 0912  BP: 110/70  Pulse: 67  Temp: 98.3 F (36.8 C)  Weight: 243 lb 3.2 oz (110.3 kg)  Height: 6' 0.8" (1.849 m)  PainSc: 0-No pain   Body mass index is 32.27 kg/m.  Wt Readings from  Last 3 Encounters:  09/27/22 243 lb 3.2 oz (110.3 kg)  03/15/22 247 lb (112 kg)  09/14/21 241 lb 9.6 oz (109.6 kg)     Objective:  Physical Exam Vitals and nursing note reviewed.  Constitutional:      Appearance: Normal appearance.  HENT:     Head: Normocephalic and atraumatic.     Right Ear: Tympanic membrane, ear canal and external ear normal.     Left Ear: Tympanic membrane, ear canal and external ear normal.     Nose:     Comments: Masked     Mouth/Throat:     Comments: Masked  Eyes:     Extraocular Movements: Extraocular movements intact.     Conjunctiva/sclera: Conjunctivae normal.     Pupils: Pupils are equal, round, and reactive to light.  Cardiovascular:     Rate and Rhythm: Normal rate and regular rhythm.     Pulses:          Dorsalis pedis pulses are 2+ on the right side and 2+ on the left side.     Heart sounds: Normal heart sounds.  Pulmonary:     Effort: Pulmonary effort is normal.     Breath sounds: Normal breath sounds.  Chest:  Breasts:    Right: Normal. No swelling, bleeding, inverted nipple, mass or nipple discharge.     Left: Normal. No swelling, bleeding, inverted nipple, mass or nipple discharge.  Abdominal:     General: Bowel sounds are normal.     Palpations: Abdomen is soft.  Genitourinary:    Comments: Deferred  Musculoskeletal:        General: Normal range of motion.     Cervical back: Normal range of motion and neck supple.  Skin:    General: Skin is warm.  Neurological:     General: No focal deficit present.     Mental Status: He is alert.  Psychiatric:        Mood and Affect: Mood normal.        Behavior: Behavior normal.         Assessment And Plan:    1. Encounter for general adult medical examination w/o abnormal findings Comments: A full exam was performed, w/ exception of DRE. He is also followed by Urology. He agrees to GI referral for CRC screening.  PATIENT IS ADVISED TO GET 30-45 MINUTES REGULAR EXERCISE NO LESS THAN  FOUR TO FIVE DAYS PER WEEK - BOTH WEIGHTBEARING EXERCISES AND AEROBIC ARE RECOMMENDED.  PATIENT IS ADVISED TO FOLLOW A HEALTHY DIET WITH AT LEAST SIX FRUITS/VEGGIES PER DAY, DECREASE INTAKE OF RED MEAT, AND TO INCREASE FISH INTAKE TO TWO DAYS PER WEEK.  MEATS/FISH SHOULD  NOT BE FRIED, BAKED OR BROILED IS PREFERABLE.  IT IS ALSO IMPORTANT TO CUT BACK ON YOUR SUGAR INTAKE. PLEASE AVOID ANYTHING WITH ADDED SUGAR, CORN SYRUP OR OTHER SWEETENERS. IF YOU MUST USE A SWEETENER, YOU CAN TRY STEVIA. IT IS ALSO IMPORTANT TO AVOID ARTIFICIALLY SWEETENERS AND DIET BEVERAGES. LASTLY, I SUGGEST WEARING SPF 50 SUNSCREEN ON EXPOSED PARTS AND ESPECIALLY WHEN IN THE DIRECT SUNLIGHT FOR AN EXTENDED PERIOD OF TIME.  PLEASE AVOID FAST FOOD RESTAURANTS AND INCREASE YOUR WATER INTAKE.  - CMP14+EGFR - CBC - Lipid panel - Hemoglobin A1c - PSA  2. Hypertensive nephropathy Comments: Chronic, well controlled. He will c/w amlodipine '10mg'$  and olmesartan '40mg'$  daily. EKG performed, NSR w/ nonspecific T abnormality. He will f/u in 6 months. - POCT Urinalysis Dipstick (81002) - Microalbumin / Creatinine Urine Ratio - EKG 12-Lead  3. Stage 3a chronic kidney disease ([facility]) Comments: Chronic, reminded to avoid NSAIDS, stay well hydrated and keep BP well controlled to decrease cardiac risk. He is also followed by Nephrology. - PTH, intact and calcium - Phosphorus - Protein electrophoresis, serum  4. Other abnormal glucose Comments: His a1c has been elevated in the past. I will recheck glucose/a1c today. He is encouraged to limit his intake of sugary beverages/foods and processed meats.  5. Vitamin D deficiency disease Comments: I will check a vitamin D level and supplement as needed. - Vitamin D (25 hydroxy)  6. Screening for colon cancer - Ambulatory referral to Gastroenterology  Patient was given opportunity to ask questions. Patient verbalized understanding of the plan and was able to repeat key elements of the plan. All  questions were answered to their satisfaction.   I, Maximino Greenland, MD, have reviewed all documentation for this visit. The documentation on 09/27/22 for the exam, diagnosis, procedures, and orders are all accurate and complete.   THE PATIENT IS ENCOURAGED TO PRACTICE SOCIAL DISTANCING DUE TO THE COVID-19 PANDEMIC.

## 2022-09-30 LAB — PROTEIN ELECTROPHORESIS, SERUM
A/G Ratio: 1.3 (ref 0.7–1.7)
Albumin ELP: 3.6 g/dL (ref 2.9–4.4)
Alpha 1: 0.2 g/dL (ref 0.0–0.4)
Alpha 2: 0.7 g/dL (ref 0.4–1.0)
Beta: 0.9 g/dL (ref 0.7–1.3)
Gamma Globulin: 1 g/dL (ref 0.4–1.8)
Globulin, Total: 2.8 g/dL (ref 2.2–3.9)

## 2022-09-30 LAB — CBC
Hematocrit: 41.3 % (ref 37.5–51.0)
Hemoglobin: 13.5 g/dL (ref 13.0–17.7)
MCH: 27.2 pg (ref 26.6–33.0)
MCHC: 32.7 g/dL (ref 31.5–35.7)
MCV: 83 fL (ref 79–97)
Platelets: 310 10*3/uL (ref 150–450)
RBC: 4.96 x10E6/uL (ref 4.14–5.80)
RDW: 14.1 % (ref 11.6–15.4)
WBC: 5.3 10*3/uL (ref 3.4–10.8)

## 2022-09-30 LAB — MICROALBUMIN / CREATININE URINE RATIO
Creatinine, Urine: 228.7 mg/dL
Microalb/Creat Ratio: 567 mg/g creat — ABNORMAL HIGH (ref 0–29)
Microalbumin, Urine: 1296.4 ug/mL

## 2022-09-30 LAB — CMP14+EGFR
ALT: 28 IU/L (ref 0–44)
AST: 24 IU/L (ref 0–40)
Albumin/Globulin Ratio: 1.6 (ref 1.2–2.2)
Albumin: 3.9 g/dL (ref 3.8–4.9)
Alkaline Phosphatase: 91 IU/L (ref 44–121)
BUN/Creatinine Ratio: 7 — ABNORMAL LOW (ref 9–20)
BUN: 13 mg/dL (ref 6–24)
Bilirubin Total: <0.2 mg/dL (ref 0.0–1.2)
CO2: 21 mmol/L (ref 20–29)
Calcium: 9.2 mg/dL (ref 8.7–10.2)
Chloride: 104 mmol/L (ref 96–106)
Creatinine, Ser: 1.93 mg/dL — ABNORMAL HIGH (ref 0.76–1.27)
Globulin, Total: 2.5 g/dL (ref 1.5–4.5)
Glucose: 99 mg/dL (ref 70–99)
Potassium: 4.7 mmol/L (ref 3.5–5.2)
Sodium: 139 mmol/L (ref 134–144)
Total Protein: 6.4 g/dL (ref 6.0–8.5)
eGFR: 41 mL/min/{1.73_m2} — ABNORMAL LOW (ref >59)

## 2022-09-30 LAB — PTH, INTACT AND CALCIUM: PTH: 66 pg/mL — ABNORMAL HIGH (ref 15–65)

## 2022-09-30 LAB — HEMOGLOBIN A1C
Est. average glucose Bld gHb Est-mCnc: 131 mg/dL
Hgb A1c MFr Bld: 6.2 % — ABNORMAL HIGH (ref 4.8–5.6)

## 2022-09-30 LAB — PSA: Prostate Specific Ag, Serum: 2.4 ng/mL (ref 0.0–4.0)

## 2022-09-30 LAB — LIPID PANEL
Chol/HDL Ratio: 4.6 ratio (ref 0.0–5.0)
Cholesterol, Total: 151 mg/dL (ref 100–199)
HDL: 33 mg/dL — ABNORMAL LOW (ref >39)
LDL Chol Calc (NIH): 80 mg/dL (ref 0–99)
Triglycerides: 228 mg/dL — ABNORMAL HIGH (ref 0–149)
VLDL Cholesterol Cal: 38 mg/dL (ref 5–40)

## 2022-09-30 LAB — PHOSPHORUS: Phosphorus: 3 mg/dL (ref 2.8–4.1)

## 2022-09-30 LAB — VITAMIN D 25 HYDROXY (VIT D DEFICIENCY, FRACTURES): Vit D, 25-Hydroxy: 15.2 ng/mL — ABNORMAL LOW (ref 30.0–100.0)

## 2022-10-03 DIAGNOSIS — R7309 Other abnormal glucose: Secondary | ICD-10-CM | POA: Insufficient documentation

## 2022-10-26 ENCOUNTER — Other Ambulatory Visit (HOSPITAL_COMMUNITY): Payer: Self-pay

## 2022-10-26 DIAGNOSIS — Z1211 Encounter for screening for malignant neoplasm of colon: Secondary | ICD-10-CM | POA: Diagnosis not present

## 2022-10-26 DIAGNOSIS — K625 Hemorrhage of anus and rectum: Secondary | ICD-10-CM | POA: Diagnosis not present

## 2022-10-26 DIAGNOSIS — I10 Essential (primary) hypertension: Secondary | ICD-10-CM | POA: Diagnosis not present

## 2022-10-26 DIAGNOSIS — E782 Mixed hyperlipidemia: Secondary | ICD-10-CM | POA: Diagnosis not present

## 2022-10-28 ENCOUNTER — Encounter (HOSPITAL_COMMUNITY): Payer: Self-pay

## 2022-10-28 ENCOUNTER — Other Ambulatory Visit (HOSPITAL_COMMUNITY): Payer: Self-pay

## 2022-11-01 ENCOUNTER — Other Ambulatory Visit (HOSPITAL_COMMUNITY): Payer: Self-pay

## 2022-11-01 ENCOUNTER — Other Ambulatory Visit: Payer: Self-pay | Admitting: Internal Medicine

## 2022-11-01 ENCOUNTER — Other Ambulatory Visit: Payer: Self-pay

## 2022-11-02 ENCOUNTER — Encounter (HOSPITAL_COMMUNITY): Payer: Self-pay

## 2022-11-02 ENCOUNTER — Other Ambulatory Visit (HOSPITAL_COMMUNITY): Payer: Self-pay

## 2022-11-02 ENCOUNTER — Other Ambulatory Visit: Payer: Self-pay

## 2022-11-02 MED ORDER — IPRATROPIUM BROMIDE 0.06 % NA SOLN
2.0000 | Freq: Three times a day (TID) | NASAL | 3 refills | Status: DC
  Filled 2022-11-02 – 2022-11-05 (×2): qty 15, 25d supply, fill #0
  Filled 2022-12-16: qty 15, 25d supply, fill #1
  Filled 2023-03-11: qty 15, 25d supply, fill #2
  Filled 2023-05-19: qty 15, 25d supply, fill #3

## 2022-11-05 ENCOUNTER — Other Ambulatory Visit: Payer: Self-pay

## 2022-11-05 ENCOUNTER — Other Ambulatory Visit (HOSPITAL_COMMUNITY): Payer: Self-pay

## 2022-11-15 DIAGNOSIS — R809 Proteinuria, unspecified: Secondary | ICD-10-CM | POA: Diagnosis not present

## 2022-11-15 DIAGNOSIS — I129 Hypertensive chronic kidney disease with stage 1 through stage 4 chronic kidney disease, or unspecified chronic kidney disease: Secondary | ICD-10-CM | POA: Diagnosis not present

## 2022-11-15 DIAGNOSIS — N183 Chronic kidney disease, stage 3 unspecified: Secondary | ICD-10-CM | POA: Diagnosis not present

## 2022-11-18 ENCOUNTER — Encounter (HOSPITAL_COMMUNITY): Payer: Self-pay

## 2022-11-18 ENCOUNTER — Other Ambulatory Visit (HOSPITAL_COMMUNITY): Payer: Self-pay

## 2022-11-18 ENCOUNTER — Other Ambulatory Visit: Payer: Self-pay

## 2022-11-18 MED ORDER — DAPAGLIFLOZIN PROPANEDIOL 10 MG PO TABS
10.0000 mg | ORAL_TABLET | Freq: Every day | ORAL | 3 refills | Status: DC
  Filled 2022-11-18 – 2022-11-25 (×2): qty 30, 30d supply, fill #0
  Filled 2023-02-07 (×2): qty 30, 30d supply, fill #1
  Filled 2023-03-11: qty 30, 30d supply, fill #2

## 2022-11-23 ENCOUNTER — Other Ambulatory Visit (HOSPITAL_COMMUNITY): Payer: Self-pay

## 2022-11-23 ENCOUNTER — Other Ambulatory Visit: Payer: Self-pay | Admitting: Internal Medicine

## 2022-11-25 ENCOUNTER — Other Ambulatory Visit: Payer: Self-pay

## 2022-11-25 MED ORDER — VALACYCLOVIR HCL 500 MG PO TABS
500.0000 mg | ORAL_TABLET | Freq: Two times a day (BID) | ORAL | 2 refills | Status: DC
  Filled 2022-11-25: qty 180, 90d supply, fill #0
  Filled 2023-05-19: qty 180, 90d supply, fill #1
  Filled 2023-08-12: qty 180, 90d supply, fill #2

## 2022-12-16 ENCOUNTER — Other Ambulatory Visit (HOSPITAL_COMMUNITY): Payer: Self-pay

## 2023-02-07 ENCOUNTER — Other Ambulatory Visit (HOSPITAL_COMMUNITY): Payer: Self-pay

## 2023-02-09 ENCOUNTER — Other Ambulatory Visit (HOSPITAL_COMMUNITY): Payer: Self-pay

## 2023-03-02 ENCOUNTER — Other Ambulatory Visit: Payer: Self-pay

## 2023-03-02 ENCOUNTER — Other Ambulatory Visit (HOSPITAL_COMMUNITY): Payer: Self-pay | Admitting: Urology

## 2023-03-02 ENCOUNTER — Other Ambulatory Visit (HOSPITAL_BASED_OUTPATIENT_CLINIC_OR_DEPARTMENT_OTHER): Payer: Self-pay

## 2023-03-02 ENCOUNTER — Ambulatory Visit (HOSPITAL_COMMUNITY)
Admission: RE | Admit: 2023-03-02 | Discharge: 2023-03-02 | Disposition: A | Discharge status: Home / Self Care | Payer: Commercial Managed Care - PPO | Source: Ambulatory Visit | Attending: Urology | Admitting: Urology

## 2023-03-02 DIAGNOSIS — Z85528 Personal history of other malignant neoplasm of kidney: Secondary | ICD-10-CM

## 2023-03-02 MED ORDER — GOLYTELY 236 G PO SOLR
ORAL | 0 refills | Status: AC
  Filled 2023-03-02: qty 4000, 1d supply, fill #0

## 2023-03-08 DIAGNOSIS — Z85528 Personal history of other malignant neoplasm of kidney: Secondary | ICD-10-CM | POA: Diagnosis not present

## 2023-03-08 DIAGNOSIS — N5201 Erectile dysfunction due to arterial insufficiency: Secondary | ICD-10-CM | POA: Diagnosis not present

## 2023-03-11 ENCOUNTER — Other Ambulatory Visit (HOSPITAL_COMMUNITY): Payer: Self-pay

## 2023-03-11 MED ORDER — SILDENAFIL CITRATE 20 MG PO TABS
100.0000 mg | ORAL_TABLET | ORAL | 11 refills | Status: DC
  Filled 2023-03-11: qty 90, 30d supply, fill #0
  Filled 2023-05-19: qty 90, 30d supply, fill #1
  Filled 2023-06-16: qty 90, 30d supply, fill #2
  Filled 2023-08-12: qty 90, 30d supply, fill #3
  Filled 2023-09-05: qty 90, 30d supply, fill #4
  Filled 2023-10-06: qty 90, 30d supply, fill #5
  Filled 2024-01-10: qty 90, 30d supply, fill #6
  Filled 2024-02-06: qty 90, 30d supply, fill #7

## 2023-03-17 ENCOUNTER — Other Ambulatory Visit (HOSPITAL_COMMUNITY): Payer: Self-pay

## 2023-03-28 ENCOUNTER — Ambulatory Visit: Payer: Commercial Managed Care - PPO | Admitting: Internal Medicine

## 2023-04-06 ENCOUNTER — Other Ambulatory Visit: Payer: Self-pay

## 2023-04-06 ENCOUNTER — Encounter: Payer: Self-pay | Admitting: Internal Medicine

## 2023-04-06 ENCOUNTER — Ambulatory Visit: Payer: Commercial Managed Care - PPO | Admitting: Internal Medicine

## 2023-04-06 ENCOUNTER — Other Ambulatory Visit (HOSPITAL_COMMUNITY): Payer: Self-pay

## 2023-04-06 VITALS — BP 130/84 | HR 76 | Temp 98.4°F | Ht 72.0 in | Wt 235.6 lb

## 2023-04-06 DIAGNOSIS — Z6831 Body mass index (BMI) 31.0-31.9, adult: Secondary | ICD-10-CM

## 2023-04-06 DIAGNOSIS — R7309 Other abnormal glucose: Secondary | ICD-10-CM

## 2023-04-06 DIAGNOSIS — E6609 Other obesity due to excess calories: Secondary | ICD-10-CM

## 2023-04-06 DIAGNOSIS — I129 Hypertensive chronic kidney disease with stage 1 through stage 4 chronic kidney disease, or unspecified chronic kidney disease: Secondary | ICD-10-CM | POA: Diagnosis not present

## 2023-04-06 DIAGNOSIS — N1831 Chronic kidney disease, stage 3a: Secondary | ICD-10-CM

## 2023-04-06 LAB — HEMOGLOBIN A1C
Est. average glucose Bld gHb Est-mCnc: 126 mg/dL
Hgb A1c MFr Bld: 6 % — ABNORMAL HIGH (ref 4.8–5.6)

## 2023-04-06 LAB — CMP14+EGFR
ALT: 19 IU/L (ref 0–44)
AST: 22 IU/L (ref 0–40)
Albumin: 4.1 g/dL (ref 3.8–4.9)
Alkaline Phosphatase: 89 IU/L (ref 44–121)
BUN/Creatinine Ratio: 10 (ref 9–20)
BUN: 19 mg/dL (ref 6–24)
Bilirubin Total: 0.2 mg/dL (ref 0.0–1.2)
CO2: 20 mmol/L (ref 20–29)
Calcium: 9.6 mg/dL (ref 8.7–10.2)
Chloride: 107 mmol/L — ABNORMAL HIGH (ref 96–106)
Creatinine, Ser: 1.9 mg/dL — ABNORMAL HIGH (ref 0.76–1.27)
Globulin, Total: 2.3 g/dL (ref 1.5–4.5)
Glucose: 89 mg/dL (ref 70–99)
Potassium: 4.8 mmol/L (ref 3.5–5.2)
Sodium: 140 mmol/L (ref 134–144)
Total Protein: 6.4 g/dL (ref 6.0–8.5)
eGFR: 42 mL/min/{1.73_m2} — ABNORMAL LOW (ref >59)

## 2023-04-06 MED ORDER — ROSUVASTATIN CALCIUM 20 MG PO TABS
20.0000 mg | ORAL_TABLET | Freq: Every day | ORAL | 2 refills | Status: DC
  Filled 2023-04-06 – 2023-05-19 (×2): qty 90, 90d supply, fill #0
  Filled 2023-08-12: qty 90, 90d supply, fill #1
  Filled 2023-11-14: qty 90, 90d supply, fill #2

## 2023-04-06 MED ORDER — LEVOCETIRIZINE DIHYDROCHLORIDE 5 MG PO TABS
5.0000 mg | ORAL_TABLET | Freq: Every evening | ORAL | 2 refills | Status: DC
  Filled 2023-04-06 – 2023-05-19 (×2): qty 90, 90d supply, fill #0
  Filled 2023-08-12: qty 90, 90d supply, fill #1
  Filled 2023-11-14: qty 90, 90d supply, fill #2

## 2023-04-06 MED ORDER — DAPAGLIFLOZIN PROPANEDIOL 10 MG PO TABS
10.0000 mg | ORAL_TABLET | Freq: Every day | ORAL | 3 refills | Status: DC
  Filled 2023-04-06: qty 30, 30d supply, fill #0
  Filled 2023-05-19: qty 30, 30d supply, fill #1
  Filled 2023-06-16: qty 30, 30d supply, fill #2
  Filled 2023-07-25: qty 30, 30d supply, fill #3
  Filled 2023-09-05: qty 30, 30d supply, fill #4
  Filled 2023-10-06: qty 30, 30d supply, fill #5
  Filled 2023-11-14: qty 30, 30d supply, fill #6
  Filled 2024-01-10 – 2024-01-12 (×2): qty 30, 30d supply, fill #7
  Filled 2024-03-22: qty 30, 30d supply, fill #8

## 2023-04-06 MED ORDER — OLMESARTAN MEDOXOMIL 40 MG PO TABS
40.0000 mg | ORAL_TABLET | Freq: Every day | ORAL | 2 refills | Status: DC
  Filled 2023-04-06 – 2023-06-16 (×2): qty 90, 90d supply, fill #0
  Filled 2023-09-22: qty 90, 90d supply, fill #1
  Filled 2023-12-19: qty 90, 90d supply, fill #2

## 2023-04-06 MED ORDER — AMLODIPINE BESYLATE 10 MG PO TABS
10.0000 mg | ORAL_TABLET | Freq: Every day | ORAL | 2 refills | Status: DC
  Filled 2023-04-06 – 2023-06-16 (×2): qty 90, 90d supply, fill #0
  Filled 2023-09-22: qty 90, 90d supply, fill #1
  Filled 2023-12-19: qty 90, 90d supply, fill #2

## 2023-04-06 NOTE — Assessment & Plan Note (Signed)
Previous labs reviewed, his A1c has been elevated in the past. I will check an A1c today. Reminded to avoid refined sugars including sugary drinks/foods and processed meats including bacon, sausages and deli meats.     

## 2023-04-06 NOTE — Assessment & Plan Note (Addendum)
Chronic, he is encouraged to stay well hydrated, avoid NSAIDs and keep BP controlled to prevent progression of CKD.  He is now on [place].  He is also followed by Ren[facility], their input is appreciated.. Most recent Renal note reviewed.

## 2023-04-06 NOTE — Patient Instructions (Signed)
Hypertension, Adult Hypertension is another name for high blood pressure. High blood pressure forces your heart to work harder to pump blood. This can cause problems over time. There are two numbers in a blood pressure reading. There is a top number (systolic) over a bottom number (diastolic). It is best to have a blood pressure that is below 120/80. What are the causes? The cause of this condition is not known. Some other conditions can lead to high blood pressure. What increases the risk? Some lifestyle factors can make you more likely to develop high blood pressure: Smoking. Not getting enough exercise or physical activity. Being overweight. Having too much fat, sugar, calories, or salt (sodium) in your diet. Drinking too much alcohol. Other risk factors include: Having any of these conditions: Heart disease. Diabetes. High cholesterol. Kidney disease. Obstructive sleep apnea. Having a family history of high blood pressure and high cholesterol. Age. The risk increases with age. Stress. What are the signs or symptoms? High blood pressure may not cause symptoms. Very high blood pressure (hypertensive crisis) may cause: Headache. Fast or uneven heartbeats (palpitations). Shortness of breath. Nosebleed. Vomiting or feeling like you may vomit (nauseous). Changes in how you see. Very bad chest pain. Feeling dizzy. Seizures. How is this treated? This condition is treated by making healthy lifestyle changes, such as: Eating healthy foods. Exercising more. Drinking less alcohol. Your doctor may prescribe medicine if lifestyle changes do not help enough and if: Your top number is above 130. Your bottom number is above 80. Your personal target blood pressure may vary. Follow these instructions at home: Eating and drinking  If told, follow the DASH eating plan. To follow this plan: Fill one half of your plate at each meal with fruits and vegetables. Fill one fourth of your plate  at each meal with whole grains. Whole grains include whole-wheat pasta, brown rice, and whole-grain bread. Eat or drink low-fat dairy products, such as skim milk or low-fat yogurt. Fill one fourth of your plate at each meal with low-fat (lean) proteins. Low-fat proteins include fish, chicken without skin, eggs, beans, and tofu. Avoid fatty meat, cured and processed meat, or chicken with skin. Avoid pre-made or processed food. Limit the amount of salt in your diet to less than 1,500 mg each day. Do not drink alcohol if: Your doctor tells you not to drink. You are pregnant, may be pregnant, or are planning to become pregnant. If you drink alcohol: Limit how much you have to: 0-1 drink a day for women. 0-2 drinks a day for men. Know how much alcohol is in your drink. In the [place] one drink equals one 12 oz bottle of beer (355 mL), one 5 oz glass of wine (148 mL), or one 1 oz glass of hard liquor (44 mL). Lifestyle  Work with your doctor to stay at a healthy weight or to lose weight. Ask your doctor what the best weight is for you. Get at least 30 minutes of exercise that causes your heart to beat faster (aerobic exercise) most days of the week. This may include walking, swimming, or biking. Get at least 30 minutes of exercise that strengthens your muscles (resistance exercise) at least 3 days a week. This may include lifting weights or doing Pilates. Do not smoke or use any products that contain nicotine or tobacco. If you need help quitting, ask your doctor. Check your blood pressure at home as told by your doctor. Keep all follow-up visits. Medicines Take over-the-counter and prescription medicines   only as told by your doctor. Follow directions carefully. Do not skip doses of blood pressure medicine. The medicine does not work as well if you skip doses. Skipping doses also puts you at risk for problems. Ask your doctor about side effects or reactions to medicines that you should watch  for. Contact a doctor if: You think you are having a reaction to the medicine you are taking. You have headaches that keep coming back. You feel dizzy. You have swelling in your ankles. You have trouble with your vision. Get help right away if: You get a very bad headache. You start to feel mixed up (confused). You feel weak or numb. You feel faint. You have very bad pain in your: Chest. Belly (abdomen). You vomit more than once. You have trouble breathing. These symptoms may be an emergency. Get help right away. Call 911. Do not wait to see if the symptoms will go away. Do not drive yourself to the hospital. Summary Hypertension is another name for high blood pressure. High blood pressure forces your heart to work harder to pump blood. For most people, a normal blood pressure is less than 120/80. Making healthy choices can help lower blood pressure. If your blood pressure does not get lower with healthy choices, you may need to take medicine. This information is not intended to replace advice given to you by your health care provider. Make sure you discuss any questions you have with your health care provider. Document Revised: 06/11/2021 Document Reviewed: 06/11/2021 Elsevier Patient Education  2024 [employer]  

## 2023-04-06 NOTE — Assessment & Plan Note (Signed)
He was congratulated on his 8lb weight loss since January 2024.  He is encouraged to aim for at least 150 minutes of exercise per week.

## 2023-04-06 NOTE — Assessment & Plan Note (Signed)
Chronic, fair control. Goal BP<120/80.  He will c/w olmesartan 20mg  daily and amlodipine 10mg  daily. He is encouraged to follow low sodium diet. He will rto in six months for his physical examination.

## 2023-04-06 NOTE — Progress Notes (Signed)
I,Victoria T Deloria Lair, CMA,acting as a Neurosurgeon for Gwynneth Aliment, MD.,have documented all relevant documentation on the behalf of Gwynneth Aliment, MD,as directed by  Gwynneth Aliment, MD while in the presence of Gwynneth Aliment, MD.  Subjective:  Patient ID: Dale Odom , male    DOB: 23-Jan-1971 , 52 y.o.   MRN: [id number]  Chief Complaint  Patient presents with   Hypertension    HPI  He is here today for bp check.  He reports compliance with medications. Denies headache, chest pain, SOB. He reports no specific questions or concerns.   He is aware he needs a colonoscopy.    Hypertension This is a chronic problem. The current episode started more than 1 year ago. The problem has been gradually improving since onset. The problem is uncontrolled. Pertinent negatives include no blurred vision. Risk factors for coronary artery disease include dyslipidemia and male gender. Past treatments include angiotensin blockers and calcium channel blockers. The current treatment provides moderate improvement. Compliance problems include exercise.  Hypertensive end-organ damage includes kidney disease.     Past Medical History:  Diagnosis Date   Cancer (HCC)    right kidney   Chronic kidney disease    CKD stage 3   Hypertension    Pre-diabetes    hx of no longer     Family History  Problem Relation Age of Onset   Hypertension Mother    Hyperlipidemia Mother    Heart Problems Father    Prostate cancer Father    Dementia Father      Current Outpatient Medications:    acetaminophen (TYLENOL) 500 MG tablet, Take 1,000 mg by mouth every 6 (six) hours as needed for mild pain or headache., Disp: , Rfl:    ipratropium (ATROVENT) 0.06 % nasal spray, Place 2 sprays into both nostrils 3 (three) times daily., Disp: 15 mL, Rfl: 3   oxybutynin (DITROPAN-XL) 10 MG 24 hr tablet, Take 1 tablet (10 mg total) by mouth daily., Disp: 90 tablet, Rfl: 11   polyethylene glycol (GOLYTELY) 236 g solution, Use as  directed, Disp: 4000 mL, Rfl: 0   sildenafil (REVATIO) 20 MG tablet, Take 5 tablets (100 mg total) by mouth as directed., Disp: 90 tablet, Rfl: 11   valACYclovir (VALTREX) 500 MG tablet, Take 1 tablet (500 mg total) by mouth 2 (two) times daily., Disp: 180 tablet, Rfl: 2   amLODipine (NORVASC) 10 MG tablet, Take 1 tablet (10 mg total) by mouth at bedtime., Disp: 90 tablet, Rfl: 2   dapagliflozin propanediol (FARXIGA) 10 MG TABS tablet, Take 1 tablet (10 mg total) by mouth daily., Disp: 90 tablet, Rfl: 3   levocetirizine (XYZAL) 5 MG tablet, Take 1 tablet (5 mg total) by mouth every evening., Disp: 90 tablet, Rfl: 2   olmesartan (BENICAR) 40 MG tablet, Take 1 tablet (40 mg total) by mouth daily., Disp: 90 tablet, Rfl: 2   rosuvastatin (CRESTOR) 20 MG tablet, Take 1 tablet (20 mg total) by mouth daily., Disp: 90 tablet, Rfl: 2   No Known Allergies   Review of Systems  Constitutional: Negative.   HENT: Negative.    Eyes:  Negative for blurred vision.  Respiratory: Negative.    Cardiovascular: Negative.   Gastrointestinal: Negative.   Skin: Negative.   Allergic/Immunologic: Negative.   Neurological: Negative.   Hematological: Negative.      Today's Vitals   04/06/23 0906  BP: 130/84  Pulse: 76  Temp: 98.4 F (36.9 C)  SpO2: 98%  Weight: 235 lb 9.6 oz (106.9 kg)  Height: 6' (1.829 m)   Body mass index is 31.95 kg/m.  Wt Readings from Last 3 Encounters:  04/06/23 235 lb 9.6 oz (106.9 kg)  09/27/22 243 lb 3.2 oz (110.3 kg)  03/15/22 247 lb (112 kg)     Objective:  Physical Exam Vitals and nursing note reviewed.  Constitutional:      Appearance: Normal appearance.  HENT:     Head: Normocephalic and atraumatic.  Eyes:     Extraocular Movements: Extraocular movements intact.  Cardiovascular:     Rate and Rhythm: Normal rate and regular rhythm.     Heart sounds: Normal heart sounds.  Pulmonary:     Effort: Pulmonary effort is normal.     Breath sounds: Normal breath  sounds.  Musculoskeletal:     Cervical back: Normal range of motion.  Skin:    General: Skin is warm.  Neurological:     General: No focal deficit present.     Mental Status: He is alert.  Psychiatric:        Mood and Affect: Mood normal.         Assessment And Plan:  Hypertensive nephropathy Assessment & Plan: Chronic, fair control. Goal BP<120/80.  He will c/w olmesartan 20mg  daily and amlodipine 10mg  daily. He is encouraged to follow low sodium diet. He will rto in six months for his physical examination.   Orders: -     CMP14+EGFR  Stage 3a chronic kidney disease (HCC) Assessment & Plan: Chronic, he is encouraged to stay well hydrated, avoid NSAIDs and keep BP controlled to prevent progression of CKD.  He is now on [place].  He is also followed by [facility], their input is appreciated.. Most recent [facility] note reviewed.   Orders: -     CMP14+EGFR  Other abnormal glucose Assessment & Plan: Previous labs reviewed, his A1c has been elevated in the past. I will check an A1c today. Reminded to avoid refined sugars including sugary drinks/foods and processed meats including bacon, sausages and deli meats.    Orders: -     Hemoglobin A1c  Class 1 obesity due to excess calories with serious comorbidity and body mass index (BMI) of 31.0 to 31.9 in adult Assessment & Plan: He was congratulated on his 8lb weight loss since January 2024.  He is encouraged to aim for at least 150 minutes of exercise per week.    Other orders -     Dapagliflozin Propanediol; Take 1 tablet (10 mg total) by mouth daily.  Dispense: 90 tablet; Refill: 3 -     amLODIPine Besylate; Take 1 tablet (10 mg total) by mouth at bedtime.  Dispense: 90 tablet; Refill: 2 -     Levocetirizine Dihydrochloride; Take 1 tablet (5 mg total) by mouth every evening.  Dispense: 90 tablet; Refill: 2 -     Olmesartan Medoxomil; Take 1 tablet (40 mg total) by mouth daily.  Dispense: 90 tablet; Refill: 2 -     Rosuvastatin  Calcium; Take 1 tablet (20 mg total) by mouth daily.  Dispense: 90 tablet; Refill: 2  He is encouraged to strive for BMI less than 30 to decrease cardiac risk. Advised to aim for at least 150 minutes of exercise per week.    Return if symptoms worsen or fail to improve.  Patient was given opportunity to ask questions. Patient verbalized understanding of the plan and was able to repeat key elements of the plan. All questions were answered to  their satisfaction.   I, Gwynneth Aliment, MD, have reviewed all documentation for this visit. The documentation on 04/06/23 for the exam, diagnosis, procedures, and orders are all accurate and complete.   IF YOU HAVE BEEN REFERRED TO A SPECIALIST, IT MAY TAKE 1-2 WEEKS TO SCHEDULE/PROCESS THE REFERRAL. IF YOU HAVE NOT HEARD FROM US/SPECIALIST IN TWO WEEKS, PLEASE GIVE [place] A CALL AT [phone].   THE PATIENT IS ENCOURAGED TO PRACTICE SOCIAL DISTANCING DUE TO THE COVID-19 PANDEMIC.

## 2023-05-19 ENCOUNTER — Other Ambulatory Visit: Payer: Self-pay

## 2023-05-19 ENCOUNTER — Other Ambulatory Visit (HOSPITAL_COMMUNITY): Payer: Self-pay

## 2023-06-06 ENCOUNTER — Other Ambulatory Visit (HOSPITAL_COMMUNITY): Payer: Self-pay

## 2023-06-06 DIAGNOSIS — N2581 Secondary hyperparathyroidism of renal origin: Secondary | ICD-10-CM | POA: Diagnosis not present

## 2023-06-06 DIAGNOSIS — D631 Anemia in chronic kidney disease: Secondary | ICD-10-CM | POA: Diagnosis not present

## 2023-06-06 DIAGNOSIS — N183 Chronic kidney disease, stage 3 unspecified: Secondary | ICD-10-CM | POA: Diagnosis not present

## 2023-06-06 DIAGNOSIS — N1832 Chronic kidney disease, stage 3b: Secondary | ICD-10-CM | POA: Diagnosis not present

## 2023-06-06 DIAGNOSIS — I129 Hypertensive chronic kidney disease with stage 1 through stage 4 chronic kidney disease, or unspecified chronic kidney disease: Secondary | ICD-10-CM | POA: Diagnosis not present

## 2023-06-06 MED ORDER — NEBIVOLOL HCL 5 MG PO TABS
5.0000 mg | ORAL_TABLET | Freq: Every day | ORAL | 6 refills | Status: DC
  Filled 2023-06-06: qty 30, 30d supply, fill #0
  Filled 2023-06-29 – 2023-07-06 (×2): qty 30, 30d supply, fill #1
  Filled 2023-08-12: qty 30, 30d supply, fill #2
  Filled 2023-09-05: qty 30, 30d supply, fill #3
  Filled 2023-10-06: qty 30, 30d supply, fill #4
  Filled 2023-11-14: qty 60, 60d supply, fill #5

## 2023-06-07 LAB — LAB REPORT - SCANNED
Albumin, Urine POC: 1037.7
Creatinine, POC: 151.9 mg/dL
EGFR: 42
Microalb Creat Ratio: 683

## 2023-06-10 ENCOUNTER — Other Ambulatory Visit: Payer: Self-pay | Admitting: Internal Medicine

## 2023-06-10 DIAGNOSIS — Z1211 Encounter for screening for malignant neoplasm of colon: Secondary | ICD-10-CM

## 2023-06-10 DIAGNOSIS — Z1212 Encounter for screening for malignant neoplasm of rectum: Secondary | ICD-10-CM

## 2023-06-16 ENCOUNTER — Other Ambulatory Visit: Payer: Self-pay

## 2023-06-16 ENCOUNTER — Other Ambulatory Visit (HOSPITAL_COMMUNITY): Payer: Self-pay

## 2023-06-16 ENCOUNTER — Other Ambulatory Visit: Payer: Self-pay | Admitting: Internal Medicine

## 2023-06-17 ENCOUNTER — Other Ambulatory Visit (HOSPITAL_COMMUNITY): Payer: Self-pay

## 2023-06-17 ENCOUNTER — Other Ambulatory Visit: Payer: Self-pay

## 2023-06-17 MED ORDER — IPRATROPIUM BROMIDE 0.06 % NA SOLN
2.0000 | Freq: Three times a day (TID) | NASAL | 3 refills | Status: DC
  Filled 2023-06-17: qty 15, 25d supply, fill #0
  Filled 2023-08-12: qty 15, 25d supply, fill #1
  Filled 2023-09-05: qty 15, 25d supply, fill #2
  Filled 2023-12-05: qty 15, 25d supply, fill #3

## 2023-06-29 ENCOUNTER — Other Ambulatory Visit (HOSPITAL_COMMUNITY): Payer: Self-pay

## 2023-07-06 ENCOUNTER — Other Ambulatory Visit (HOSPITAL_COMMUNITY): Payer: Self-pay

## 2023-07-06 MED ORDER — OXYBUTYNIN CHLORIDE ER 10 MG PO TB24
10.0000 mg | ORAL_TABLET | Freq: Every day | ORAL | 11 refills | Status: DC
  Filled 2023-07-06: qty 90, 90d supply, fill #0
  Filled 2023-10-06: qty 90, 90d supply, fill #1
  Filled 2024-01-10: qty 90, 90d supply, fill #2
  Filled 2024-04-16: qty 90, 90d supply, fill #3
  Filled 2024-05-30: qty 90, 90d supply, fill #4

## 2023-07-07 ENCOUNTER — Other Ambulatory Visit (HOSPITAL_COMMUNITY): Payer: Self-pay

## 2023-07-08 ENCOUNTER — Other Ambulatory Visit (HOSPITAL_COMMUNITY): Payer: Self-pay

## 2023-07-25 ENCOUNTER — Other Ambulatory Visit (HOSPITAL_COMMUNITY): Payer: Self-pay

## 2023-07-25 ENCOUNTER — Other Ambulatory Visit: Payer: Self-pay

## 2023-08-12 ENCOUNTER — Other Ambulatory Visit (HOSPITAL_COMMUNITY): Payer: Self-pay

## 2023-09-05 ENCOUNTER — Other Ambulatory Visit (HOSPITAL_COMMUNITY): Payer: Self-pay

## 2023-09-22 ENCOUNTER — Other Ambulatory Visit (HOSPITAL_COMMUNITY): Payer: Self-pay

## 2023-10-06 ENCOUNTER — Ambulatory Visit (INDEPENDENT_AMBULATORY_CARE_PROVIDER_SITE_OTHER): Payer: Commercial Managed Care - PPO | Admitting: Internal Medicine

## 2023-10-06 ENCOUNTER — Encounter: Payer: Self-pay | Admitting: Internal Medicine

## 2023-10-06 ENCOUNTER — Other Ambulatory Visit (HOSPITAL_BASED_OUTPATIENT_CLINIC_OR_DEPARTMENT_OTHER): Payer: Self-pay

## 2023-10-06 ENCOUNTER — Other Ambulatory Visit (HOSPITAL_COMMUNITY): Payer: Self-pay

## 2023-10-06 VITALS — BP 128/82 | HR 74 | Temp 98.4°F | Ht 72.0 in | Wt 242.4 lb

## 2023-10-06 DIAGNOSIS — M25512 Pain in left shoulder: Secondary | ICD-10-CM | POA: Diagnosis not present

## 2023-10-06 DIAGNOSIS — N1831 Chronic kidney disease, stage 3a: Secondary | ICD-10-CM

## 2023-10-06 DIAGNOSIS — M25511 Pain in right shoulder: Secondary | ICD-10-CM

## 2023-10-06 DIAGNOSIS — Z0000 Encounter for general adult medical examination without abnormal findings: Secondary | ICD-10-CM | POA: Diagnosis not present

## 2023-10-06 DIAGNOSIS — Z85528 Personal history of other malignant neoplasm of kidney: Secondary | ICD-10-CM

## 2023-10-06 DIAGNOSIS — Z6832 Body mass index (BMI) 32.0-32.9, adult: Secondary | ICD-10-CM | POA: Diagnosis not present

## 2023-10-06 DIAGNOSIS — G8929 Other chronic pain: Secondary | ICD-10-CM

## 2023-10-06 DIAGNOSIS — M545 Low back pain, unspecified: Secondary | ICD-10-CM

## 2023-10-06 DIAGNOSIS — I129 Hypertensive chronic kidney disease with stage 1 through stage 4 chronic kidney disease, or unspecified chronic kidney disease: Secondary | ICD-10-CM

## 2023-10-06 DIAGNOSIS — E6609 Other obesity due to excess calories: Secondary | ICD-10-CM | POA: Diagnosis not present

## 2023-10-06 DIAGNOSIS — R7309 Other abnormal glucose: Secondary | ICD-10-CM

## 2023-10-06 DIAGNOSIS — Z1211 Encounter for screening for malignant neoplasm of colon: Secondary | ICD-10-CM

## 2023-10-06 DIAGNOSIS — Z23 Encounter for immunization: Secondary | ICD-10-CM | POA: Diagnosis not present

## 2023-10-06 DIAGNOSIS — E66811 Obesity, class 1: Secondary | ICD-10-CM

## 2023-10-06 LAB — POCT URINALYSIS DIPSTICK
Bilirubin, UA: NEGATIVE
Glucose, UA: POSITIVE — AB
Ketones, UA: NEGATIVE
Leukocytes, UA: NEGATIVE
Nitrite, UA: NEGATIVE
Protein, UA: POSITIVE — AB
Spec Grav, UA: >=1.03 — AB (ref 1.010–1.025)
Urobilinogen, UA: 0.2 U/dL
pH, UA: 5.5 (ref 5.0–8.0)

## 2023-10-06 NOTE — Patient Instructions (Addendum)
Ginger, turmeric, cinnamon   Health Maintenance, Male Adopting a healthy lifestyle and getting preventive care are important in promoting health and wellness. Ask your health care provider about: The right schedule for you to have regular tests and exams. Things you can do on your own to prevent diseases and keep yourself healthy. What should I know about diet, weight, and exercise? Eat a healthy diet  Eat a diet that includes plenty of vegetables, fruits, low-fat dairy products, and lean protein. Do not eat a lot of foods that are high in solid fats, added sugars, or sodium. Maintain a healthy weight Body mass index (BMI) is a measurement that can be used to identify possible weight problems. It estimates body fat based on height and weight. Your health care provider can help determine your BMI and help you achieve or maintain a healthy weight. Get regular exercise Get regular exercise. This is one of the most important things you can do for your health. Most adults should: Exercise for at least 150 minutes each week. The exercise should increase your heart rate and make you sweat (moderate-intensity exercise). Do strengthening exercises at least twice a week. This is in addition to the moderate-intensity exercise. Spend less time sitting. Even light physical activity can be beneficial. Watch cholesterol and blood lipids Have your blood tested for lipids and cholesterol at 54 years of age, then have this test every 5 years. You may need to have your cholesterol levels checked more often if: Your lipid or cholesterol levels are high. You are older than 54 years of age. You are at high risk for heart disease. What should I know about cancer screening? Many types of cancers can be detected early and may often be prevented. Depending on your health history and family history, you may need to have cancer screening at various ages. This may include screening for: Colorectal cancer. Prostate  cancer. Skin cancer. Lung cancer. What should I know about heart disease, diabetes, and high blood pressure? Blood pressure and heart disease High blood pressure causes heart disease and increases the risk of stroke. This is more likely to develop in people who have high blood pressure readings or are overweight. Talk with your health care provider about your target blood pressure readings. Have your blood pressure checked: Every 3-5 years if you are 89-54 years of age. Every year if you are 54 years old or older. If you are between the ages of 43 and 54 and are a current or former smoker, ask your health care provider if you should have a one-time screening for abdominal aortic aneurysm (AAA). Diabetes Have regular diabetes screenings. This checks your fasting blood sugar level. Have the screening done: Once every three years after age 54 if you are at a normal weight and have a low risk for diabetes. More often and at a younger age if you are overweight or have a high risk for diabetes. What should I know about preventing infection? Hepatitis B If you have a higher risk for hepatitis B, you should be screened for this virus. Talk with your health care provider to find out if you are at risk for hepatitis B infection. Hepatitis C Blood testing is recommended for: Everyone born from [birth year] through 1966. Anyone with known risk factors for hepatitis C. Sexually transmitted infections (STIs) You should be screened each year for STIs, including gonorrhea and chlamydia, if: You are sexually active and are younger than 54 years of age. You are older than 54  years of age and your health care provider tells you that you are at risk for this type of infection. Your sexual activity has changed since you were last screened, and you are at increased risk for chlamydia or gonorrhea. Ask your health care provider if you are at risk. Ask your health care provider about whether you are at high risk for HIV.  Your health care provider may recommend a prescription medicine to help prevent HIV infection. If you choose to take medicine to prevent HIV, you should first get tested for HIV. You should then be tested every 3 months for as long as you are taking the medicine. Follow these instructions at home: Alcohol use Do not drink alcohol if your health care provider tells you not to drink. If you drink alcohol: Limit how much you have to 0-2 drinks a day. Know how much alcohol is in your drink. In the [place] one drink equals one 12 oz bottle of beer (355 mL), one 5 oz glass of wine (148 mL), or one 1 oz glass of hard liquor (44 mL). Lifestyle Do not use any products that contain nicotine or tobacco. These products include cigarettes, chewing tobacco, and vaping devices, such as e-cigarettes. If you need help quitting, ask your health care provider. Do not use street drugs. Do not share needles. Ask your health care provider for help if you need support or information about quitting drugs. General instructions Schedule regular health, dental, and eye exams. Stay current with your vaccines. Tell your health care provider if: You often feel depressed. You have ever been abused or do not feel safe at home. Summary Adopting a healthy lifestyle and getting preventive care are important in promoting health and wellness. Follow your health care provider's instructions about healthy diet, exercising, and getting tested or screened for diseases. Follow your health care provider's instructions on monitoring your cholesterol and blood pressure. This information is not intended to replace advice given to you by your health care provider. Make sure you discuss any questions you have with your health care provider. Document Revised: 01/12/2021 Document Reviewed: 01/12/2021 Elsevier Patient Education  2024 [employer].

## 2023-10-06 NOTE — Progress Notes (Unsigned)
I,Victoria T Deloria Lair, CMA,acting as a Neurosurgeon for Gwynneth Aliment, MD.,have documented all relevant documentation on the behalf of Gwynneth Aliment, MD,as directed by  Gwynneth Aliment, MD while in the presence of Gwynneth Aliment, MD.  Subjective:   Patient ID: Dale Odom , male    DOB: 11/06/1970 , 53 y.o.   MRN: [id number]  Chief Complaint  Patient presents with  . Annual Exam  . Hypertension    HPI  He is here today for physical exam. He reports compliance with medications. He has no specific concerns or complaints at this time.  He reports receiving flu & covid vaccine at work. He does not remember the date.  He completes prostate exams with urologist.    Hypertension This is a chronic problem. The current episode started more than 1 year ago. The problem has been gradually improving since onset. The problem is uncontrolled. Pertinent negatives include no blurred vision. Past treatments include angiotensin blockers and calcium channel blockers. The current treatment provides moderate improvement. Compliance problems include exercise.  Hypertensive end-organ damage includes kidney disease.     Past Medical History:  Diagnosis Date  . Cancer (HCC)    right kidney  . Chronic kidney disease    CKD stage 3  . Hypertension   . Pre-diabetes    hx of no longer     Family History  Problem Relation Age of Onset  . Hypertension Mother   . Hyperlipidemia Mother   . Heart Problems Father   . Prostate cancer Father   . Dementia Father      Current Outpatient Medications:  .  acetaminophen (TYLENOL) 500 MG tablet, Take 1,000 mg by mouth every 6 (six) hours as needed for mild pain or headache., Disp: , Rfl:  .  amLODipine (NORVASC) 10 MG tablet, Take 1 tablet (10 mg total) by mouth at bedtime., Disp: 90 tablet, Rfl: 2 .  dapagliflozin propanediol (FARXIGA) 10 MG TABS tablet, Take 1 tablet (10 mg total) by mouth daily., Disp: 90 tablet, Rfl: 3 .  ipratropium (ATROVENT) 0.06 % nasal  spray, Place 2 sprays into both nostrils 3 (three) times daily., Disp: 15 mL, Rfl: 3 .  levocetirizine (XYZAL) 5 MG tablet, Take 1 tablet (5 mg total) by mouth every evening., Disp: 90 tablet, Rfl: 2 .  nebivolol (BYSTOLIC) 5 MG tablet, Take 1 tablet (5 mg total) by mouth daily., Disp: 30 tablet, Rfl: 6 .  olmesartan (BENICAR) 40 MG tablet, Take 1 tablet (40 mg total) by mouth daily., Disp: 90 tablet, Rfl: 2 .  oxybutynin (DITROPAN-XL) 10 MG 24 hr tablet, Take 1 tablet (10 mg total) by mouth daily., Disp: 90 tablet, Rfl: 11 .  polyethylene glycol (GOLYTELY) 236 g solution, Use as directed, Disp: 4000 mL, Rfl: 0 .  rosuvastatin (CRESTOR) 20 MG tablet, Take 1 tablet (20 mg total) by mouth daily., Disp: 90 tablet, Rfl: 2 .  sildenafil (REVATIO) 20 MG tablet, Take 5 tablets (100 mg total) by mouth as directed., Disp: 90 tablet, Rfl: 11 .  valACYclovir (VALTREX) 500 MG tablet, Take 1 tablet (500 mg total) by mouth 2 (two) times daily., Disp: 180 tablet, Rfl: 2   No Known Allergies   Men's preventive visit. Patient Health Questionnaire (PHQ-2) is  Flowsheet Row Office Visit from 10/06/2023 in [facility]  PHQ-2 Total Score 0     . Patient is on a low salt diet. Marital status: Married. Relevant history for alcohol use  is:  Social History   Substance and Sexual Activity  Alcohol Use No  . Relevant history for tobacco use is:  Social History   Tobacco Use  Smoking Status Former  . Current packs/day: 0.00  . Average packs/day: 0.3 packs/day for 29.0 years (7.3 ttl pk-yrs)  . Types: Cigarettes  . Start date: 06/04/1990  . Quit date: 06/05/2019  . Years since quitting: 4.3  Smokeless Tobacco Never  Tobacco Comments   2 a day  .   Review of Systems  Constitutional: Negative.   HENT: Negative.    Eyes: Negative.  Negative for blurred vision.  Respiratory: Negative.    Cardiovascular: Negative.   Gastrointestinal: Negative.   Endocrine: Negative.    Genitourinary: Negative.   Musculoskeletal:  Positive for arthralgias and back pain.       He c/o b/l shoulder pain. Denies fall/trauma. Has stiffness up on awakening.   Skin: Negative.   Allergic/Immunologic: Negative.   Neurological: Negative.   Hematological: Negative.      Today's Vitals   10/06/23 0918  BP: 128/82  Pulse: 74  Temp: 98.4 F (36.9 C)  SpO2: 98%  Weight: 242 lb 6.4 oz (110 kg)  Height: 6' (1.829 m)   Body mass index is 32.88 kg/m.  Wt Readings from Last 3 Encounters:  10/06/23 242 lb 6.4 oz (110 kg)  04/06/23 235 lb 9.6 oz (106.9 kg)  09/27/22 243 lb 3.2 oz (110.3 kg)    Objective:  Physical Exam Vitals and nursing note reviewed.  Constitutional:      Appearance: Normal appearance.  HENT:     Head: Normocephalic and atraumatic.     Right Ear: Tympanic membrane, ear canal and external ear normal.     Left Ear: Tympanic membrane, ear canal and external ear normal.     Nose:     Comments: Masked     Mouth/Throat:     Comments: Masked  Eyes:     Extraocular Movements: Extraocular movements intact.     Conjunctiva/sclera: Conjunctivae normal.     Pupils: Pupils are equal, round, and reactive to light.  Cardiovascular:     Rate and Rhythm: Normal rate and regular rhythm.     Pulses:          Dorsalis pedis pulses are 2+ on the right side and 2+ on the left side.     Heart sounds: Normal heart sounds.  Pulmonary:     Effort: Pulmonary effort is normal.     Breath sounds: Normal breath sounds.  Chest:  Breasts:    Right: Normal. No swelling, bleeding, inverted nipple, mass or nipple discharge.     Left: Normal. No swelling, bleeding, inverted nipple, mass or nipple discharge.  Abdominal:     General: Bowel sounds are normal.     Palpations: Abdomen is soft.  Genitourinary:    Comments: Deferred  Musculoskeletal:        General: Normal range of motion.     Cervical back: Normal range of motion and neck supple.  Skin:    General: Skin is  warm.  Neurological:     General: No focal deficit present.     Mental Status: He is alert.  Psychiatric:        Mood and Affect: Mood normal.        Behavior: Behavior normal.        Assessment And Plan:    Encounter for general adult medical examination w/o abnormal findings Assessment & Plan: A  full exam was performed.  DRE deferred  He is advised to get 30-45 minutes of regular exercise, no less than four to five days per week. Both weight-bearing and aerobic exercises are recommended.  He is advised to follow a healthy diet with at least six fruits/veggies per day, decrease intake of red meat and other saturated fats and to increase fish intake to twice weekly.  Meats/fish should not be fried -- baked, boiled or broiled is preferable. It is also important to cut back on your sugar intake.  Be sure to read labels - try to avoid anything with added sugar, high fructose corn syrup or other sweeteners.  If you must use a sweetener, you can try stevia or monkfruit.  It is also important to avoid artificially sweetened foods/beverages and diet drinks. Lastly, wear SPF 50 sunscreen on exposed skin and when in direct sunlight for an extended period of time.  Be sure to avoid fast food restaurants and aim for at least 60 ounces of water daily.      Orders: -     CMP14+EGFR -     Lipid panel -     CBC -     PSA Total (Reflex To Free)  Hypertensive nephropathy Assessment & Plan: Chronic, fair control. Goal BP<120/80.  EKG performed, NSR w/ nonspecific T abnormality.  He will c/w olmesartan 20mg  daily and amlodipine 10mg  daily. He is encouraged to follow low sodium diet. He will rto in six months for his physical examination.   Orders: -     POCT urinalysis dipstick -     Microalbumin / creatinine urine ratio -     EKG 12-Lead  Stage 3a chronic kidney disease (HCC) Assessment & Plan: Chronic, he is encouraged to stay well hydrated, avoid NSAIDs and keep BP controlled to prevent progression  of CKD.  He is now on [place].  He is also followed by Renal, their input is appreciated.. Most recent Renal note reviewed.   Orders: -     PTH, intact and calcium -     Phosphorus -     Protein electrophoresis, serum  Other abnormal glucose Assessment & Plan: Previous labs reviewed, his A1c has been elevated in the past. I will check an A1c today. Reminded to avoid refined sugars including sugary drinks/foods and processed meats including bacon, sausages and deli meats.    Orders: -     Hemoglobin A1c  Chronic bilateral low back pain without sciatica  Chronic pain of both shoulders  Class 1 obesity due to excess calories with serious comorbidity and body mass index (BMI) of 32.0 to 32.9 in adult Assessment & Plan: He has gained 7lbs since July 2024.  He is encouraged to aim for at least 150 minutes of exercise per week, including strength training.    Screen for colon cancer -     Cologuard  Immunization due -     Pneumococcal conjugate vaccine 20-valent  He is encouraged to strive for BMI less than 30 to decrease cardiac risk. Advised to aim for at least 150 minutes of exercise per week.    Return for 1 year hm, 6 month bp. Patient was given opportunity to ask questions. Patient verbalized understanding of the plan and was able to repeat key elements of the plan. All questions were answered to their satisfaction.   I, Gwynneth Aliment, MD, have reviewed all documentation for this visit. The documentation on 10/08/23 for the exam, diagnosis, procedures, and orders are all  accurate and complete.

## 2023-10-07 LAB — MICROALBUMIN / CREATININE URINE RATIO
Creatinine, Urine: 220.3 mg/dL
Microalb/Creat Ratio: 715 mg/g{creat} — ABNORMAL HIGH (ref 0–29)
Microalbumin, Urine: 1574.9 ug/mL

## 2023-10-08 NOTE — Assessment & Plan Note (Signed)
Chronic, fair control. Goal BP<120/80.  EKG performed, NSR w/ nonspecific T abnormality.  He will c/w olmesartan 20mg  daily and amlodipine 10mg  daily. He is encouraged to follow low sodium diet. He will rto in six months for his physical examination.

## 2023-10-08 NOTE — Assessment & Plan Note (Signed)
A full exam was performed.  DRE deferred.  He is advised to get 30-45 minutes of regular exercise, no less than four to five days per week. Both weight-bearing and aerobic exercises are recommended.  He is advised to follow a healthy diet with at least six fruits/veggies per day, decrease intake of red meat and other saturated fats and to increase fish intake to twice weekly.  Meats/fish should not be fried -- baked, boiled or broiled is preferable. It is also important to cut back on your sugar intake.  Be sure to read labels - try to avoid anything with added sugar, high fructose corn syrup or other sweeteners.  If you must use a sweetener, you can try stevia or monkfruit.  It is also important to avoid artificially sweetened foods/beverages and diet drinks. Lastly, wear SPF 50 sunscreen on exposed skin and when in direct sunlight for an extended period of time.  Be sure to avoid fast food restaurants and aim for at least 60 ounces of water daily.

## 2023-10-08 NOTE — Assessment & Plan Note (Signed)
Chronic, today he has full ROM. I will refer him to Ortho for further evaluation and radiographic studies.

## 2023-10-08 NOTE — Assessment & Plan Note (Signed)
Chronic, he is encouraged to stay well hydrated, avoid NSAIDs and keep BP controlled to prevent progression of CKD.  He is now on [place].  He is also followed by Renal, their input is appreciated.. Most recent Renal note reviewed.

## 2023-10-08 NOTE — Assessment & Plan Note (Signed)
He has gained 7lbs since July 2024.  He is encouraged to aim for at least 150 minutes of exercise per week, including strength training.

## 2023-10-08 NOTE — Assessment & Plan Note (Signed)
 Previous labs reviewed, his A1c has been elevated in the past. I will check an A1c today. Reminded to avoid refined sugars including sugary drinks/foods and processed meats including bacon, sausages and deli meats.

## 2023-10-09 NOTE — Assessment & Plan Note (Signed)
Chronic, he is encouraged to perform stretching exercises on a regular basis. If persistent, will consider PT and imaging.

## 2023-10-17 LAB — PROTEIN ELECTROPHORESIS, SERUM
A/G Ratio: 1.2 (ref 0.7–1.7)
Albumin ELP: 3.7 g/dL (ref 2.9–4.4)
Alpha 1: 0.2 g/dL (ref 0.0–0.4)
Alpha 2: 0.7 g/dL (ref 0.4–1.0)
Beta: 1 g/dL (ref 0.7–1.3)
Gamma Globulin: 1.2 g/dL (ref 0.4–1.8)
Globulin, Total: 3.1 g/dL (ref 2.2–3.9)

## 2023-10-17 LAB — CMP14+EGFR
ALT: 31 [IU]/L (ref 0–44)
AST: 25 [IU]/L (ref 0–40)
Albumin: 4.2 g/dL (ref 3.8–4.9)
Alkaline Phosphatase: 91 [IU]/L (ref 44–121)
BUN/Creatinine Ratio: 9 (ref 9–20)
BUN: 15 mg/dL (ref 6–24)
Bilirubin Total: 0.3 mg/dL (ref 0.0–1.2)
CO2: 22 mmol/L (ref 20–29)
Calcium: 9.3 mg/dL (ref 8.7–10.2)
Chloride: 105 mmol/L (ref 96–106)
Creatinine, Ser: 1.75 mg/dL — ABNORMAL HIGH (ref 0.76–1.27)
Globulin, Total: 2.6 g/dL (ref 1.5–4.5)
Glucose: 106 mg/dL — ABNORMAL HIGH (ref 70–99)
Potassium: 4.7 mmol/L (ref 3.5–5.2)
Sodium: 141 mmol/L (ref 134–144)
Total Protein: 6.8 g/dL (ref 6.0–8.5)
eGFR: 46 mL/min/{1.73_m2} — ABNORMAL LOW (ref >59)

## 2023-10-17 LAB — LIPID PANEL
Chol/HDL Ratio: 4.4 {ratio} (ref 0.0–5.0)
Cholesterol, Total: 198 mg/dL (ref 100–199)
HDL: 45 mg/dL (ref >39)
LDL Chol Calc (NIH): 129 mg/dL — ABNORMAL HIGH (ref 0–99)
Triglycerides: 132 mg/dL (ref 0–149)
VLDL Cholesterol Cal: 24 mg/dL (ref 5–40)

## 2023-10-17 LAB — CBC
Hematocrit: 46.4 % (ref 37.5–51.0)
Hemoglobin: 15.2 g/dL (ref 13.0–17.7)
MCH: 28.1 pg (ref 26.6–33.0)
MCHC: 32.8 g/dL (ref 31.5–35.7)
MCV: 86 fL (ref 79–97)
Platelets: 234 10*3/uL (ref 150–450)
RBC: 5.4 x10E6/uL (ref 4.14–5.80)
RDW: 14.1 % (ref 11.6–15.4)
WBC: 6.2 10*3/uL (ref 3.4–10.8)

## 2023-10-17 LAB — PSA TOTAL (REFLEX TO FREE): Prostate Specific Ag, Serum: 4 ng/mL (ref 0.0–4.0)

## 2023-10-17 LAB — HEMOGLOBIN A1C
Est. average glucose Bld gHb Est-mCnc: 137 mg/dL
Hgb A1c MFr Bld: 6.4 % — ABNORMAL HIGH (ref 4.8–5.6)

## 2023-10-17 LAB — PTH, INTACT AND CALCIUM: PTH: 59 pg/mL (ref 15–65)

## 2023-10-17 LAB — PHOSPHORUS: Phosphorus: 3.1 mg/dL (ref 2.8–4.1)

## 2023-10-17 LAB — FPSA% REFLEX
% FREE PSA: 32 %
PSA, FREE: 1.28 ng/mL

## 2023-10-20 ENCOUNTER — Other Ambulatory Visit (HOSPITAL_COMMUNITY): Payer: Self-pay

## 2023-10-20 ENCOUNTER — Other Ambulatory Visit: Payer: Self-pay | Admitting: Internal Medicine

## 2023-10-20 MED ORDER — TRAMADOL HCL 50 MG PO TABS
50.0000 mg | ORAL_TABLET | Freq: Four times a day (QID) | ORAL | 0 refills | Status: DC | PRN
  Filled 2023-10-20: qty 20, 5d supply, fill #0

## 2023-10-21 ENCOUNTER — Other Ambulatory Visit: Payer: Self-pay

## 2023-11-14 ENCOUNTER — Other Ambulatory Visit (HOSPITAL_COMMUNITY): Payer: Self-pay

## 2023-11-15 ENCOUNTER — Other Ambulatory Visit: Payer: Self-pay

## 2023-12-05 ENCOUNTER — Other Ambulatory Visit: Payer: Self-pay | Admitting: Internal Medicine

## 2023-12-05 ENCOUNTER — Other Ambulatory Visit (HOSPITAL_COMMUNITY): Payer: Self-pay

## 2023-12-05 DIAGNOSIS — N1832 Chronic kidney disease, stage 3b: Secondary | ICD-10-CM | POA: Diagnosis not present

## 2023-12-05 DIAGNOSIS — N2581 Secondary hyperparathyroidism of renal origin: Secondary | ICD-10-CM | POA: Diagnosis not present

## 2023-12-05 DIAGNOSIS — D631 Anemia in chronic kidney disease: Secondary | ICD-10-CM | POA: Diagnosis not present

## 2023-12-05 DIAGNOSIS — I129 Hypertensive chronic kidney disease with stage 1 through stage 4 chronic kidney disease, or unspecified chronic kidney disease: Secondary | ICD-10-CM | POA: Diagnosis not present

## 2023-12-06 ENCOUNTER — Other Ambulatory Visit: Payer: Self-pay

## 2023-12-06 ENCOUNTER — Other Ambulatory Visit: Payer: Self-pay | Admitting: Internal Medicine

## 2023-12-06 ENCOUNTER — Other Ambulatory Visit (HOSPITAL_COMMUNITY): Payer: Self-pay

## 2023-12-06 LAB — LAB REPORT - SCANNED
Albumin, Urine POC: 724
Creatinine, POC: 133.9 mg/dL
EGFR: 37
Microalb Creat Ratio: 541

## 2023-12-06 MED ORDER — TRAMADOL HCL 50 MG PO TABS
50.0000 mg | ORAL_TABLET | Freq: Four times a day (QID) | ORAL | 0 refills | Status: AC | PRN
  Filled 2023-12-06: qty 20, 5d supply, fill #0

## 2023-12-19 ENCOUNTER — Other Ambulatory Visit (HOSPITAL_COMMUNITY): Payer: Self-pay

## 2023-12-19 MED ORDER — DAPAGLIFLOZIN PROPANEDIOL 10 MG PO TABS
10.0000 mg | ORAL_TABLET | Freq: Every day | ORAL | 3 refills | Status: AC
  Filled 2023-12-19: qty 30, 30d supply, fill #0
  Filled 2024-02-06: qty 30, 30d supply, fill #1

## 2023-12-19 MED ORDER — WEGOVY 0.25 MG/0.5ML ~~LOC~~ SOAJ
0.2500 mg | SUBCUTANEOUS | 2 refills | Status: DC
Start: 2023-12-19 — End: 2024-04-16
  Filled 2023-12-19: qty 2, 28d supply, fill #0

## 2023-12-20 ENCOUNTER — Other Ambulatory Visit: Payer: Self-pay

## 2024-01-10 ENCOUNTER — Other Ambulatory Visit (HOSPITAL_COMMUNITY): Payer: Self-pay

## 2024-01-11 ENCOUNTER — Other Ambulatory Visit: Payer: Self-pay

## 2024-01-11 ENCOUNTER — Other Ambulatory Visit (HOSPITAL_COMMUNITY): Payer: Self-pay

## 2024-01-11 MED ORDER — NEBIVOLOL HCL 5 MG PO TABS
5.0000 mg | ORAL_TABLET | Freq: Every day | ORAL | 6 refills | Status: DC
  Filled 2024-01-11: qty 30, 30d supply, fill #0
  Filled 2024-02-06: qty 30, 30d supply, fill #1
  Filled 2024-03-22: qty 30, 30d supply, fill #2
  Filled 2024-05-02: qty 30, 30d supply, fill #3
  Filled 2024-05-30: qty 30, 30d supply, fill #4
  Filled 2024-07-03: qty 30, 30d supply, fill #5
  Filled 2024-07-30: qty 30, 30d supply, fill #6

## 2024-02-06 ENCOUNTER — Other Ambulatory Visit: Payer: Self-pay | Admitting: Internal Medicine

## 2024-02-06 ENCOUNTER — Telehealth: Payer: Self-pay

## 2024-02-06 ENCOUNTER — Other Ambulatory Visit (HOSPITAL_COMMUNITY): Payer: Self-pay

## 2024-02-06 MED ORDER — ROSUVASTATIN CALCIUM 20 MG PO TABS
20.0000 mg | ORAL_TABLET | Freq: Every day | ORAL | 2 refills | Status: AC
  Filled 2024-02-06: qty 90, 90d supply, fill #0
  Filled 2024-05-30: qty 90, 90d supply, fill #1
  Filled 2024-09-05: qty 90, 90d supply, fill #2

## 2024-02-06 MED ORDER — LEVOCETIRIZINE DIHYDROCHLORIDE 5 MG PO TABS
5.0000 mg | ORAL_TABLET | Freq: Every evening | ORAL | 2 refills | Status: AC
  Filled 2024-02-06: qty 90, 90d supply, fill #0
  Filled 2024-05-30: qty 90, 90d supply, fill #1
  Filled 2024-09-05: qty 90, 90d supply, fill #2

## 2024-02-06 NOTE — Telephone Encounter (Signed)
 This request came to [facility] rather than your office.

## 2024-02-06 NOTE — Telephone Encounter (Signed)
 Copied from CRM [id number]. Topic: General - Other >> Feb 06, 2024  9:28 AM Earnestine Goes B wrote: Reason for CRM: pt called to follow up on FMLA documents he sent over a month ago. Please call pt back [phone]

## 2024-02-07 ENCOUNTER — Other Ambulatory Visit (HOSPITAL_COMMUNITY): Payer: Self-pay

## 2024-02-07 ENCOUNTER — Other Ambulatory Visit: Payer: Self-pay

## 2024-03-05 DIAGNOSIS — I700 Atherosclerosis of aorta: Secondary | ICD-10-CM | POA: Diagnosis not present

## 2024-03-05 DIAGNOSIS — N281 Cyst of kidney, acquired: Secondary | ICD-10-CM | POA: Diagnosis not present

## 2024-03-05 DIAGNOSIS — C641 Malignant neoplasm of right kidney, except renal pelvis: Secondary | ICD-10-CM | POA: Diagnosis not present

## 2024-03-05 DIAGNOSIS — K5730 Diverticulosis of large intestine without perforation or abscess without bleeding: Secondary | ICD-10-CM | POA: Diagnosis not present

## 2024-03-22 ENCOUNTER — Other Ambulatory Visit: Payer: Self-pay | Admitting: Internal Medicine

## 2024-03-22 ENCOUNTER — Other Ambulatory Visit (HOSPITAL_COMMUNITY): Payer: Self-pay

## 2024-03-22 ENCOUNTER — Other Ambulatory Visit: Payer: Self-pay

## 2024-03-22 MED ORDER — OLMESARTAN MEDOXOMIL 40 MG PO TABS
40.0000 mg | ORAL_TABLET | Freq: Every day | ORAL | 2 refills | Status: AC
  Filled 2024-03-22: qty 90, 90d supply, fill #0
  Filled 2024-05-30 – 2024-06-19 (×2): qty 90, 90d supply, fill #1
  Filled 2024-09-26: qty 90, 90d supply, fill #2

## 2024-03-22 MED ORDER — AMLODIPINE BESYLATE 10 MG PO TABS
10.0000 mg | ORAL_TABLET | Freq: Every day | ORAL | 2 refills | Status: AC
  Filled 2024-03-22: qty 90, 90d supply, fill #0
  Filled 2024-05-30 – 2024-06-19 (×2): qty 90, 90d supply, fill #1
  Filled 2024-09-26: qty 90, 90d supply, fill #2

## 2024-03-23 ENCOUNTER — Other Ambulatory Visit (HOSPITAL_COMMUNITY): Payer: Self-pay

## 2024-03-23 MED ORDER — SILDENAFIL CITRATE 20 MG PO TABS
100.0000 mg | ORAL_TABLET | ORAL | 11 refills | Status: AC
  Filled 2024-03-23: qty 90, 30d supply, fill #0
  Filled 2024-05-02: qty 90, 30d supply, fill #1
  Filled 2024-05-30: qty 90, 30d supply, fill #2
  Filled 2024-07-03: qty 90, 30d supply, fill #3
  Filled 2024-07-30: qty 90, 30d supply, fill #4
  Filled 2024-09-26: qty 90, 30d supply, fill #5

## 2024-04-04 ENCOUNTER — Ambulatory Visit: Payer: Commercial Managed Care - PPO | Admitting: Internal Medicine

## 2024-04-16 ENCOUNTER — Other Ambulatory Visit (HOSPITAL_COMMUNITY): Payer: Self-pay

## 2024-04-16 ENCOUNTER — Other Ambulatory Visit: Payer: Self-pay

## 2024-04-16 ENCOUNTER — Encounter: Payer: Self-pay | Admitting: Internal Medicine

## 2024-04-16 ENCOUNTER — Other Ambulatory Visit (HOSPITAL_BASED_OUTPATIENT_CLINIC_OR_DEPARTMENT_OTHER): Payer: Self-pay

## 2024-04-16 ENCOUNTER — Other Ambulatory Visit: Payer: Self-pay | Admitting: Internal Medicine

## 2024-04-16 ENCOUNTER — Ambulatory Visit: Admitting: Internal Medicine

## 2024-04-16 VITALS — BP 110/82 | HR 68 | Temp 98.5°F | Ht 72.0 in | Wt 230.2 lb

## 2024-04-16 DIAGNOSIS — Z1211 Encounter for screening for malignant neoplasm of colon: Secondary | ICD-10-CM | POA: Diagnosis not present

## 2024-04-16 DIAGNOSIS — M7989 Other specified soft tissue disorders: Secondary | ICD-10-CM | POA: Diagnosis not present

## 2024-04-16 DIAGNOSIS — Z85528 Personal history of other malignant neoplasm of kidney: Secondary | ICD-10-CM | POA: Insufficient documentation

## 2024-04-16 DIAGNOSIS — E6609 Other obesity due to excess calories: Secondary | ICD-10-CM

## 2024-04-16 DIAGNOSIS — I129 Hypertensive chronic kidney disease with stage 1 through stage 4 chronic kidney disease, or unspecified chronic kidney disease: Secondary | ICD-10-CM

## 2024-04-16 DIAGNOSIS — N1831 Chronic kidney disease, stage 3a: Secondary | ICD-10-CM

## 2024-04-16 DIAGNOSIS — E7800 Pure hypercholesterolemia, unspecified: Secondary | ICD-10-CM | POA: Diagnosis not present

## 2024-04-16 DIAGNOSIS — M2550 Pain in unspecified joint: Secondary | ICD-10-CM | POA: Diagnosis not present

## 2024-04-16 DIAGNOSIS — E66811 Obesity, class 1: Secondary | ICD-10-CM

## 2024-04-16 DIAGNOSIS — R7309 Other abnormal glucose: Secondary | ICD-10-CM

## 2024-04-16 DIAGNOSIS — Z6831 Body mass index (BMI) 31.0-31.9, adult: Secondary | ICD-10-CM | POA: Diagnosis not present

## 2024-04-16 MED ORDER — DAPAGLIFLOZIN PROPANEDIOL 10 MG PO TABS
10.0000 mg | ORAL_TABLET | Freq: Every day | ORAL | 3 refills | Status: AC
Start: 2024-04-16 — End: ?
  Filled 2024-04-16: qty 30, 30d supply, fill #0
  Filled 2024-05-30: qty 30, 30d supply, fill #1
  Filled 2024-07-03: qty 30, 30d supply, fill #0
  Filled 2024-07-03: qty 30, 30d supply, fill #2
  Filled 2024-07-30 (×2): qty 30, 30d supply, fill #1
  Filled 2024-09-05 (×2): qty 30, 30d supply, fill #2

## 2024-04-16 MED ORDER — IPRATROPIUM BROMIDE 0.06 % NA SOLN
2.0000 | Freq: Three times a day (TID) | NASAL | 3 refills | Status: AC
Start: 2024-04-16 — End: ?
  Filled 2024-04-16: qty 15, 25d supply, fill #0
  Filled 2024-05-30: qty 15, 25d supply, fill #1
  Filled 2024-06-19: qty 15, 25d supply, fill #2
  Filled 2024-07-30: qty 15, 25d supply, fill #3

## 2024-04-16 NOTE — Assessment & Plan Note (Signed)
 Chronic, fair control. Goal BP<120/80.  He will c/w olmesartan  20mg , nebivolol  and amlodipine  10mg  daily. He is encouraged to follow low sodium diet. He will rto in six months for his physical examination.

## 2024-04-16 NOTE — Assessment & Plan Note (Signed)
 He is s/p right nephrectomy. He is also closely followed by Urology.

## 2024-04-16 NOTE — Assessment & Plan Note (Signed)
 Previous labs reviewed, his A1c has been elevated in the past. I will check an A1c today. Reminded to avoid refined sugars including sugary drinks/foods and processed meats including bacon, sausages and deli meats.

## 2024-04-16 NOTE — Assessment & Plan Note (Signed)
 Chronic, he is encouraged to stay well hydrated, avoid NSAIDs and keep BP controlled to prevent progression of CKD.  He is now on Farxiga .  He is also followed by Renal, their input is appreciated.. Most recent Renal note reviewed.

## 2024-04-16 NOTE — Assessment & Plan Note (Signed)
 Exam is suggestive of possible epidermoid cyst. He agrees to Dermatology referral.

## 2024-04-16 NOTE — Assessment & Plan Note (Signed)
 He was congratulated on his 12 lbs weight loss since January 2025.  He is encouraged to aim for at least 150 minutes of exercise per week, including strength training.

## 2024-04-16 NOTE — Progress Notes (Signed)
 I,Dale Dale Odom, CMA,acting as a Neurosurgeon for Dale LOISE Slocumb, MD.,have documented all relevant documentation on the behalf of Dale LOISE Slocumb, MD,as directed by  Dale LOISE Slocumb, MD while in the presence of Dale LOISE Slocumb, MD.  Subjective:  Patient ID: Dale Dale Odom , male    DOB: Oct 09, 1970 , 53 y.o.   MRN: [id number]  Chief Complaint  Patient presents with   Hypertension    Patient presents today for bpc. She reports compliance with medications. Denies headache, chest pain & sob. He would like a referral for Derm. He has a bump on the left side of his neck.  He denies completing cologuard.     HPI Discussed the use of AI scribe software for clinical note transcription with the patient, who gave verbal consent to proceed.  History of Present Illness Dale Dale Odom is a 53 year old male with chronic kidney disease stage 3 and hypertension who presents for a routine follow-up visit.  He feels generally well and has been engaging in more physical activity, such as walking, although he has not returned to the gym since his kidney cancer diagnosis during the COVID pandemic. He finds gym activities challenging due to arthritis, which causes significant joint pain and stiffness.  He has a history of kidney cancer and chronic kidney disease stage 3. He follows up with a nephrologist annually and is currently on amlodipine , nebivolol , and olmesartan  for blood pressure management. He recently underwent a CT scan at the urologist's office but has not yet received the results. No prostate issues are reported.  He is on several medications including amlodipine  10 mg, nebivolol  5 mg, olmesartan  40 mg, rosuvastatin  20 mg, and Farxiga . He was offered Recovi for weight loss but did not start it due to cost and lack of insurance coverage. He has lost about twelve pounds recently and is focused on maintaining this weight loss.  He recently experienced the bereavement of his father, which has allowed  him more time to focus on his health. He has not pursued grief counseling.  He is not interested at this time.  He is due for a colon cancer screening. There is no family history of colon cancer.   Hypertension This is a chronic problem. The current episode started more than 1 year ago. The problem has been gradually improving since onset. The problem is uncontrolled. Pertinent negatives include no blurred vision. Risk factors for coronary artery disease include dyslipidemia and male gender. Past treatments include angiotensin blockers and calcium  channel blockers. The current treatment provides moderate improvement. Compliance problems include exercise.  Hypertensive end-organ damage includes kidney disease.     Past Medical History:  Diagnosis Date   Cancer (HCC)    right kidney   Chronic kidney disease    CKD stage 3   Hypertension    Pre-diabetes    hx of no longer     Family History  Problem Relation Dale Odom of Onset   Hypertension Mother    Hyperlipidemia Mother    Heart Problems Father    Prostate cancer Father    Dementia Father      Current Outpatient Medications:    acetaminophen  (TYLENOL ) 500 MG tablet, Take 1,000 mg by mouth every 6 (six) hours as needed for mild pain or headache., Disp: , Rfl:    amLODipine  (NORVASC ) 10 MG tablet, Take 1 tablet (10 mg total) by mouth at bedtime., Disp: 90 tablet, Rfl: 2   dapagliflozin  propanediol (FARXIGA ) 10 MG TABS tablet,  Take 1 tablet (10 mg total) by mouth daily., Disp: 90 tablet, Rfl: 3   levocetirizine (XYZAL ) 5 MG tablet, Take 1 tablet (5 mg total) by mouth every evening., Disp: 90 tablet, Rfl: 2   nebivolol  (BYSTOLIC ) 5 MG tablet, Take 1 tablet (5 mg total) by mouth daily., Disp: 30 tablet, Rfl: 6   olmesartan  (BENICAR ) 40 MG tablet, Take 1 tablet (40 mg total) by mouth daily., Disp: 90 tablet, Rfl: 2   oxybutynin  (DITROPAN -XL) 10 MG 24 hr tablet, Take 1 tablet (10 mg total) by mouth daily., Disp: 90 tablet, Rfl: 11    polyethylene glycol (GOLYTELY ) 236 g solution, Use as directed, Disp: 4000 mL, Rfl: 0   rosuvastatin  (CRESTOR ) 20 MG tablet, Take 1 tablet (20 mg total) by mouth daily., Disp: 90 tablet, Rfl: 2   sildenafil  (REVATIO ) 20 MG tablet, Take 5 tablets (100 mg total) by mouth as directed., Disp: 90 tablet, Rfl: 11   traMADol  (ULTRAM ) 50 MG tablet, Take 1 tablet (50 mg total) by mouth every 6 (six) hours as needed., Disp: 20 tablet, Rfl: 0   valACYclovir  (VALTREX ) 500 MG tablet, Take 1 tablet (500 mg total) by mouth 2 (two) times daily., Disp: 180 tablet, Rfl: 2   dapagliflozin  propanediol (FARXIGA ) 10 MG TABS tablet, Take 1 tablet (10 mg total) by mouth daily., Disp: 90 tablet, Rfl: 3   ipratropium (ATROVENT ) 0.06 % nasal spray, Place 2 sprays into both nostrils 3 (three) times daily., Disp: 15 mL, Rfl: 3   No Known Allergies   Review of Systems  Constitutional: Negative.   Eyes:  Negative for blurred vision.  Respiratory: Negative.    Cardiovascular: Negative.   Gastrointestinal: Negative.   Endocrine: Negative.   Skin: Negative.   Allergic/Immunologic: Negative.   Hematological: Negative.      Today's Vitals   04/16/24 1228  BP: 110/82  Pulse: 68  Temp: 98.5 F (36.9 C)  SpO2: 98%  Weight: 230 lb 3.2 oz (104.4 kg)  Height: 6' (1.829 m)   Body mass index is 31.22 kg/m.  Wt Readings from Last 3 Encounters:  04/16/24 230 lb 3.2 oz (104.4 kg)  10/06/23 242 lb 6.4 oz (110 kg)  04/06/23 235 lb 9.6 oz (106.9 kg)     Objective:  Physical Exam Vitals and nursing note reviewed.  Constitutional:      Appearance: Normal appearance.  HENT:     Head: Normocephalic and atraumatic.  Eyes:     Extraocular Movements: Extraocular movements intact.  Cardiovascular:     Rate and Rhythm: Normal rate and regular rhythm.     Heart sounds: Normal heart sounds.  Pulmonary:     Effort: Pulmonary effort is normal.     Breath sounds: Normal breath sounds.  Musculoskeletal:     Cervical back:  Normal range of motion.  Skin:    General: Skin is warm.  Neurological:     General: No focal deficit present.     Mental Status: He is alert.  Psychiatric:        Mood and Affect: Mood normal.          Assessment And Plan:  Hypertensive nephropathy Assessment & Plan: Chronic, fair control. Goal BP<120/80.  He will c/w olmesartan  20mg , nebivolol  and amlodipine  10mg  daily. He is encouraged to follow low sodium diet. He will rto in six months for his physical examination.   Orders: -     CMP14+EGFR  Stage 3a chronic kidney disease (HCC) Assessment & Plan: Chronic, he  is encouraged to stay well hydrated, avoid NSAIDs and keep BP controlled to prevent progression of CKD.  He is now on Farxiga .  He is also followed by Renal, their input is appreciated.. Most recent Renal note reviewed.   Orders: -     CMP14+EGFR  Soft tissue mass Assessment & Plan: Exam is suggestive of possible epidermoid cyst. He agrees to Dermatology referral.   Orders: -     Ambulatory referral to Dermatology  Pure hypercholesterolemia Assessment & Plan: Chronic, he is currently on rosuvastatin  20mg  daily.  - Follow heart healthy lifestyle.    Arthralgia, unspecified joint Assessment & Plan: Generalized, symptoms are causing discomfort during gym activities. Discussed alternative strength training options. - Encourage strength training at home if gym activities are too painful. - Follow anti-inflammatory diet - Consider topical pain creams   Other abnormal glucose Assessment & Plan: Previous labs reviewed, his A1c has been elevated in the past. I will check an A1c today. Reminded to avoid refined sugars including sugary drinks/foods and processed meats including bacon, sausages and deli meats.    Orders: -     CMP14+EGFR -     Hemoglobin A1c  Class 1 obesity due to excess calories with serious comorbidity and body mass index (BMI) of 31.0 to 31.9 in adult Assessment & Plan: He was  congratulated on his 12 lbs weight loss since January 2025.  He is encouraged to aim for at least 150 minutes of exercise per week, including strength training.    Special screening for malignant neoplasm of colon -     Cologuard; Future  History of kidney cancer Assessment & Plan: He is s/p right nephrectomy. He is also closely followed by Urology.    Other orders -     Dapagliflozin  Propanediol; Take 1 tablet (10 mg total) by mouth daily.  Dispense: 90 tablet; Refill: 3   Return if symptoms worsen or fail to improve.  Patient was given opportunity to ask questions. Patient verbalized understanding of the plan and was able to repeat key elements of the plan. All questions were answered to their satisfaction.   I, Dale LOISE Slocumb, MD, have reviewed all documentation for this visit. The documentation on 04/16/24 for the exam, diagnosis, procedures, and orders are all accurate and complete.   IF YOU HAVE BEEN REFERRED TO A SPECIALIST, IT MAY TAKE 1-2 WEEKS TO SCHEDULE/PROCESS THE REFERRAL. IF YOU HAVE NOT HEARD FROM US /SPECIALIST IN TWO WEEKS, PLEASE GIVE US  A CALL AT [phone].   THE PATIENT IS ENCOURAGED TO PRACTICE SOCIAL DISTANCING DUE TO THE COVID-19 PANDEMIC.

## 2024-04-16 NOTE — Assessment & Plan Note (Signed)
 Chronic, he is currently on rosuvastatin  20mg  daily.  - Follow heart healthy lifestyle.

## 2024-04-16 NOTE — Patient Instructions (Signed)
 Hypertension, Adult Hypertension is another name for high blood pressure. High blood pressure forces your heart to work harder to pump blood. This can cause problems over time. There are two numbers in a blood pressure reading. There is a top number (systolic) over a bottom number (diastolic). It is best to have a blood pressure that is below 120/80. What are the causes? The cause of this condition is not known. Some other conditions can lead to high blood pressure. What increases the risk? Some lifestyle factors can make you more likely to develop high blood pressure: Smoking. Not getting enough exercise or physical activity. Being overweight. Having too much fat, sugar, calories, or salt (sodium) in your diet. Drinking too much alcohol . Other risk factors include: Having any of these conditions: Heart disease. Diabetes. High cholesterol. Kidney disease. Obstructive sleep apnea. Having a family history of high blood pressure and high cholesterol. Age. The risk increases with age. Stress. What are the signs or symptoms? High blood pressure may not cause symptoms. Very high blood pressure (hypertensive crisis) may cause: Headache. Fast or uneven heartbeats (palpitations). Shortness of breath. Nosebleed. Vomiting or feeling like you may vomit (nauseous). Changes in how you see. Very bad chest pain. Feeling dizzy. Seizures. How is this treated? This condition is treated by making healthy lifestyle changes, such as: Eating healthy foods. Exercising more. Drinking less alcohol . Your doctor may prescribe medicine if lifestyle changes do not help enough and if: Your top number is above 130. Your bottom number is above 80. Your personal target blood pressure may vary. Follow these instructions at home: Eating and drinking  If told, follow the DASH eating plan. To follow this plan: Fill one half of your plate at each meal with fruits and vegetables. Fill one fourth of your plate  at each meal with whole grains. Whole grains include whole-wheat pasta, brown rice, and whole-grain bread. Eat or drink low-fat dairy products, such as skim milk or low-fat yogurt. Fill one fourth of your plate at each meal with low-fat (lean) proteins. Low-fat proteins include fish, chicken without skin, eggs, beans, and tofu. Avoid fatty meat, cured and processed meat, or chicken with skin. Avoid pre-made or processed food. Limit the amount of salt in your diet to less than 1,500 mg each day. Do not drink alcohol  if: Your doctor tells you not to drink. You are pregnant, may be pregnant, or are planning to become pregnant. If you drink alcohol : Limit how much you have to: 0-1 drink a day for women. 0-2 drinks a day for men. Know how much alcohol  is in your drink. In the [place] one drink equals one 12 oz bottle of beer (355 mL), one 5 oz glass of wine (148 mL), or one 1 oz glass of hard liquor (44 mL). Lifestyle  Work with your doctor to stay at a healthy weight or to lose weight. Ask your doctor what the best weight is for you. Get at least 30 minutes of exercise that causes your heart to beat faster (aerobic exercise) most days of the week. This may include walking, swimming, or biking. Get at least 30 minutes of exercise that strengthens your muscles (resistance exercise) at least 3 days a week. This may include lifting weights or doing Pilates. Do not smoke or use any products that contain nicotine  or tobacco. If you need help quitting, ask your doctor. Check your blood pressure at home as told by your doctor. Keep all follow-up visits. Medicines Take over-the-counter and prescription medicines  only as told by your doctor. Follow directions carefully. Do not skip doses of blood pressure medicine. The medicine does not work as well if you skip doses. Skipping doses also puts you at risk for problems. Ask your doctor about side effects or reactions to medicines that you should watch  for. Contact a doctor if: You think you are having a reaction to the medicine you are taking. You have headaches that keep coming back. You feel dizzy. You have swelling in your ankles. You have trouble with your vision. Get help right away if: You get a very bad headache. You start to feel mixed up (confused). You feel weak or numb. You feel faint. You have very bad pain in your: Chest. Belly (abdomen). You vomit more than once. You have trouble breathing. These symptoms may be an emergency. Get help right away. Call 911. Do not wait to see if the symptoms will go away. Do not drive yourself to the hospital. Summary Hypertension is another name for high blood pressure. High blood pressure forces your heart to work harder to pump blood. For most people, a normal blood pressure is less than 120/80. Making healthy choices can help lower blood pressure. If your blood pressure does not get lower with healthy choices, you may need to take medicine. This information is not intended to replace advice given to you by your health care provider. Make sure you discuss any questions you have with your health care provider. Document Revised: 06/11/2021 Document Reviewed: 06/11/2021 [employer] Patient Education  2024 [employer].

## 2024-04-16 NOTE — Assessment & Plan Note (Signed)
 Generalized, symptoms are causing discomfort during gym activities. Discussed alternative strength training options. - Encourage strength training at home if gym activities are too painful. - Follow anti-inflammatory diet - Consider topical pain creams

## 2024-04-17 LAB — CMP14+EGFR
ALT: 27 IU/L (ref 0–44)
AST: 28 IU/L (ref 0–40)
Albumin: 4.1 g/dL (ref 3.8–4.9)
Alkaline Phosphatase: 97 IU/L (ref 44–121)
BUN/Creatinine Ratio: 9 (ref 9–20)
BUN: 17 mg/dL (ref 6–24)
Bilirubin Total: 0.3 mg/dL (ref 0.0–1.2)
CO2: 20 mmol/L (ref 20–29)
Calcium: 9.4 mg/dL (ref 8.7–10.2)
Chloride: 106 mmol/L (ref 96–106)
Creatinine, Ser: 1.98 mg/dL — ABNORMAL HIGH (ref 0.76–1.27)
Globulin, Total: 2.4 g/dL (ref 1.5–4.5)
Glucose: 93 mg/dL (ref 70–99)
Potassium: 5.1 mmol/L (ref 3.5–5.2)
Sodium: 140 mmol/L (ref 134–144)
Total Protein: 6.5 g/dL (ref 6.0–8.5)
eGFR: 39 mL/min/1.73 — ABNORMAL LOW (ref >59)

## 2024-04-17 LAB — HEMOGLOBIN A1C
Est. average glucose Bld gHb Est-mCnc: 128 mg/dL
Hgb A1c MFr Bld: 6.1 % — ABNORMAL HIGH (ref 4.8–5.6)

## 2024-04-18 ENCOUNTER — Ambulatory Visit: Payer: Self-pay | Admitting: Internal Medicine

## 2024-05-02 ENCOUNTER — Other Ambulatory Visit (HOSPITAL_COMMUNITY): Payer: Self-pay

## 2024-05-30 ENCOUNTER — Other Ambulatory Visit (HOSPITAL_COMMUNITY): Payer: Self-pay

## 2024-05-30 ENCOUNTER — Other Ambulatory Visit: Payer: Self-pay

## 2024-06-04 ENCOUNTER — Other Ambulatory Visit (HOSPITAL_COMMUNITY): Payer: Self-pay

## 2024-06-04 ENCOUNTER — Other Ambulatory Visit: Payer: Self-pay | Admitting: Internal Medicine

## 2024-06-05 ENCOUNTER — Other Ambulatory Visit: Payer: Self-pay

## 2024-06-05 ENCOUNTER — Other Ambulatory Visit (HOSPITAL_COMMUNITY): Payer: Self-pay

## 2024-06-05 MED ORDER — VALACYCLOVIR HCL 500 MG PO TABS
500.0000 mg | ORAL_TABLET | Freq: Two times a day (BID) | ORAL | 2 refills | Status: AC
  Filled 2024-06-05: qty 180, 90d supply, fill #0
  Filled 2024-09-05: qty 180, 90d supply, fill #1

## 2024-06-19 ENCOUNTER — Other Ambulatory Visit (HOSPITAL_COMMUNITY): Payer: Self-pay

## 2024-07-02 DIAGNOSIS — I129 Hypertensive chronic kidney disease with stage 1 through stage 4 chronic kidney disease, or unspecified chronic kidney disease: Secondary | ICD-10-CM | POA: Diagnosis not present

## 2024-07-02 DIAGNOSIS — N1832 Chronic kidney disease, stage 3b: Secondary | ICD-10-CM | POA: Diagnosis not present

## 2024-07-02 LAB — LAB REPORT - SCANNED: EGFR: 42

## 2024-07-03 ENCOUNTER — Other Ambulatory Visit: Payer: Self-pay

## 2024-07-03 ENCOUNTER — Other Ambulatory Visit (HOSPITAL_COMMUNITY): Payer: Self-pay

## 2024-07-04 ENCOUNTER — Other Ambulatory Visit: Payer: Self-pay

## 2024-07-05 ENCOUNTER — Other Ambulatory Visit: Payer: Self-pay

## 2024-07-06 ENCOUNTER — Other Ambulatory Visit: Payer: Self-pay

## 2024-07-23 ENCOUNTER — Ambulatory Visit: Admitting: Dermatology

## 2024-07-30 ENCOUNTER — Other Ambulatory Visit (HOSPITAL_COMMUNITY): Payer: Self-pay

## 2024-07-30 ENCOUNTER — Other Ambulatory Visit: Payer: Self-pay

## 2024-07-30 ENCOUNTER — Other Ambulatory Visit: Payer: Self-pay | Admitting: Internal Medicine

## 2024-07-31 ENCOUNTER — Other Ambulatory Visit: Payer: Self-pay

## 2024-08-08 ENCOUNTER — Other Ambulatory Visit (HOSPITAL_COMMUNITY): Payer: Self-pay

## 2024-08-08 ENCOUNTER — Other Ambulatory Visit: Payer: Self-pay | Admitting: Internal Medicine

## 2024-08-16 ENCOUNTER — Other Ambulatory Visit: Payer: Self-pay

## 2024-08-16 ENCOUNTER — Other Ambulatory Visit (HOSPITAL_COMMUNITY): Payer: Self-pay

## 2024-08-16 MED ORDER — OXYBUTYNIN CHLORIDE ER 10 MG PO TB24
10.0000 mg | ORAL_TABLET | Freq: Every day | ORAL | 11 refills | Status: AC
  Filled 2024-08-16: qty 90, 90d supply, fill #0

## 2024-09-05 ENCOUNTER — Other Ambulatory Visit (HOSPITAL_COMMUNITY): Payer: Self-pay

## 2024-09-05 ENCOUNTER — Other Ambulatory Visit: Payer: Self-pay

## 2024-09-07 ENCOUNTER — Other Ambulatory Visit: Payer: Self-pay

## 2024-09-10 ENCOUNTER — Other Ambulatory Visit: Payer: Self-pay

## 2024-09-10 ENCOUNTER — Other Ambulatory Visit (HOSPITAL_COMMUNITY): Payer: Self-pay

## 2024-09-10 MED ORDER — NEBIVOLOL HCL 5 MG PO TABS
5.0000 mg | ORAL_TABLET | Freq: Every day | ORAL | 6 refills | Status: AC
  Filled 2024-09-10: qty 30, 30d supply, fill #0

## 2024-09-13 ENCOUNTER — Other Ambulatory Visit: Payer: Self-pay

## 2024-09-26 ENCOUNTER — Other Ambulatory Visit (HOSPITAL_COMMUNITY): Payer: Self-pay

## 2024-10-08 ENCOUNTER — Encounter: Payer: Self-pay | Admitting: Internal Medicine

## 2024-11-28 ENCOUNTER — Ambulatory Visit: Admitting: Internal Medicine
